# Patient Record
Sex: Female | Born: 1990
Health system: Southern US, Community
[De-identification: ages and names within clinical notes are randomized; demographics above are authoritative.]

## PROBLEM LIST (undated history)

## (undated) DIAGNOSIS — E079 Disorder of thyroid, unspecified: Secondary | ICD-10-CM

## (undated) DIAGNOSIS — E039 Hypothyroidism, unspecified: Secondary | ICD-10-CM

## (undated) HISTORY — DX: Disorder of thyroid, unspecified: E07.9

## (undated) HISTORY — DX: Hypothyroidism, unspecified: E03.9

## (undated) HISTORY — PX: TONSILLECTOMY AND ADENOIDECTOMY: SHX28

## (undated) HISTORY — PX: TONSILLECTOMY: SUR1361

---

## 2014-08-05 ENCOUNTER — Emergency Department: Payer: Self-pay | Admitting: Emergency Medicine

## 2014-08-05 LAB — URINALYSIS, COMPLETE
BACTERIA: NONE SEEN
Bilirubin,UR: NEGATIVE
Blood: NEGATIVE
GLUCOSE, UR: NEGATIVE mg/dL (ref 0–75)
KETONE: NEGATIVE
Leukocyte Esterase: NEGATIVE
Nitrite: NEGATIVE
PH: 6 (ref 4.5–8.0)
Protein: NEGATIVE
RBC,UR: <1 /HPF (ref 0–5)
Specific Gravity: 1.009 (ref 1.003–1.030)
Squamous Epithelial: <1
WBC UR: <1 /HPF (ref 0–5)

## 2014-08-05 LAB — COMPREHENSIVE METABOLIC PANEL
Albumin: 3.7 g/dL (ref 3.4–5.0)
Alkaline Phosphatase: 71 U/L
Anion Gap: 10 (ref 7–16)
BUN: 11 mg/dL (ref 7–18)
Bilirubin,Total: 0.3 mg/dL (ref 0.2–1.0)
CO2: 27 mmol/L (ref 21–32)
Calcium, Total: 9 mg/dL (ref 8.5–10.1)
Chloride: 104 mmol/L (ref 98–107)
Creatinine: 0.81 mg/dL (ref 0.60–1.30)
EGFR (Non-African Amer.): >60
Glucose: 79 mg/dL (ref 65–99)
Osmolality: 280 (ref 275–301)
Potassium: 4 mmol/L (ref 3.5–5.1)
SGOT(AST): 26 U/L (ref 15–37)
SGPT (ALT): 20 U/L
Sodium: 141 mmol/L (ref 136–145)
TOTAL PROTEIN: 7.6 g/dL (ref 6.4–8.2)

## 2014-08-05 LAB — CBC WITH DIFFERENTIAL/PLATELET
Basophil #: 0.1 10*3/uL (ref 0.0–0.1)
Basophil %: 0.5 %
Eosinophil #: 1 10*3/uL — ABNORMAL HIGH (ref 0.0–0.7)
Eosinophil %: 9.2 %
HCT: 42.3 % (ref 35.0–47.0)
HGB: 14.1 g/dL (ref 12.0–16.0)
Lymphocyte #: 2.7 10*3/uL (ref 1.0–3.6)
Lymphocyte %: 26.1 %
MCH: 30.9 pg (ref 26.0–34.0)
MCHC: 33.3 g/dL (ref 32.0–36.0)
MCV: 93 fL (ref 80–100)
Monocyte #: 1.1 x10 3/mm — ABNORMAL HIGH (ref 0.2–0.9)
Monocyte %: 10.3 %
Neutrophil #: 5.6 10*3/uL (ref 1.4–6.5)
Neutrophil %: 53.9 %
Platelet: 196 10*3/uL (ref 150–440)
RBC: 4.56 10*6/uL (ref 3.80–5.20)
RDW: 12.9 % (ref 11.5–14.5)
WBC: 10.4 10*3/uL (ref 3.6–11.0)

## 2014-08-05 LAB — LIPASE, BLOOD: Lipase: 184 U/L (ref 73–393)

## 2015-10-21 DIAGNOSIS — E038 Other specified hypothyroidism: Secondary | ICD-10-CM | POA: Insufficient documentation

## 2015-10-21 DIAGNOSIS — E063 Autoimmune thyroiditis: Secondary | ICD-10-CM

## 2016-08-13 DIAGNOSIS — N946 Dysmenorrhea, unspecified: Secondary | ICD-10-CM | POA: Insufficient documentation

## 2016-08-13 DIAGNOSIS — E039 Hypothyroidism, unspecified: Secondary | ICD-10-CM | POA: Insufficient documentation

## 2016-12-31 DIAGNOSIS — E063 Autoimmune thyroiditis: Secondary | ICD-10-CM | POA: Insufficient documentation

## 2017-12-31 ENCOUNTER — Ambulatory Visit (INDEPENDENT_AMBULATORY_CARE_PROVIDER_SITE_OTHER): Payer: BLUE CROSS/BLUE SHIELD | Admitting: Certified Nurse Midwife

## 2017-12-31 ENCOUNTER — Encounter: Payer: Self-pay | Admitting: Certified Nurse Midwife

## 2017-12-31 VITALS — BP 105/68 | HR 76 | Ht 66.0 in | Wt 207.2 lb

## 2017-12-31 DIAGNOSIS — Z0000 Encounter for general adult medical examination without abnormal findings: Secondary | ICD-10-CM | POA: Diagnosis not present

## 2017-12-31 NOTE — Progress Notes (Signed)
GYNECOLOGY ANNUAL PREVENTATIVE CARE ENCOUNTER NOTE  Subjective:   Julie Rowe is a 27 y.o. No obstetric history on file. female here for a routine annual gynecologic exam.  Current complaints: none. She states that she will be trying to get pregnant this year.    Denies abnormal vaginal bleeding, discharge, pelvic pain, problems with intercourse or other gynecologic concerns.    Gynecologic History Patient's last menstrual period was 12/09/2017 (exact date). Contraception: OCP (estrogen/progesterone) Last Pap: 2017. Results were: normal per pt Last mammogram: n/a.   Obstetric History OB History  Gravida Para Term Preterm AB Living  0 0 0 0 0 0  SAB TAB Ectopic Multiple Live Births  0 0 0 0 0        Past Medical History:  Diagnosis Date  . Hypothyroid   . Thyroid disease     Past Surgical History:  Procedure Laterality Date  . TONSILLECTOMY AND ADENOIDECTOMY      Current Outpatient Medications on File Prior to Visit  Medication Sig Dispense Refill  . SRONYX 0.1-20 MG-MCG tablet   6   No current facility-administered medications on file prior to visit.     No Known Allergies  Social History   Socioeconomic History  . Marital status: Unknown    Spouse name: Not on file  . Number of children: Not on file  . Years of education: Not on file  . Highest education level: Not on file  Social Needs  . Financial resource strain: Not on file  . Food insecurity - worry: Not on file  . Food insecurity - inability: Not on file  . Transportation needs - medical: Not on file  . Transportation needs - non-medical: Not on file  Occupational History  . Not on file  Tobacco Use  . Smoking status: Never Smoker  . Smokeless tobacco: Never Used  Substance and Sexual Activity  . Alcohol use: No    Frequency: Never  . Drug use: No  . Sexual activity: Yes    Birth control/protection: Pill  Other Topics Concern  . Not on file  Social History Narrative  . Not on file     Family History  Problem Relation Age of Onset  . Diabetes Mother   . Diabetes Brother   . Diabetes Maternal Grandmother     The following portions of the patient's history were reviewed and updated as appropriate: allergies, current medications, past family history, past medical history, past social history, past surgical history and problem list.  Review of Systems Pertinent items noted in HPI and remainder of comprehensive ROS otherwise negative.   Objective:  BP 105/68   Pulse 76   Ht 5\' 6"  (1.676 m)   Wt 207 lb 3 oz (94 kg)   LMP 12/09/2017 (Exact Date)   BMI 33.44 kg/m  CONSTITUTIONAL: Well-developed, well-nourished, obese female in no acute distress.  HENT:  Normocephalic, atraumatic, External right and left ear normal. Oropharynx is clear and moist EYES: Conjunctivae and EOM are normal. Pupils are equal, round, and reactive to light. No scleral icterus.  NECK: Normal range of motion, supple, no masses.  Normal thyroid.  SKIN: Skin is warm and dry. No rash noted. Not diaphoretic. No erythema. No pallor. NEUROLOGIC: Alert and oriented to person, place, and time. Normal reflexes, muscle tone coordination. No cranial nerve deficit noted. PSYCHIATRIC: Normal mood and affect. Normal behavior. Normal judgment and thought content. CARDIOVASCULAR: Normal heart rate noted, regular rhythm RESPIRATORY: Clear to auscultation bilaterally. Effort and breath sounds  normal, no problems with respiration noted. BREASTS: Symmetric in size. No masses, skin changes, nipple drainage, or lymphadenopathy. ABDOMEN: Soft, normal bowel sounds, no distention noted.  No tenderness, rebound or guarding.  PELVIC: Normal appearing external genitalia; normal appearing vaginal mucosa and cervix.  No abnormal discharge noted.  Pap smear obtained. ectropion present, contract bleeding present. Normal uterine size, no other palpable masses, no uterine or adnexal tenderness. MUSCULOSKELETAL: Normal range of  motion. No tenderness.  No cyanosis, clubbing, or edema.  2+ distal pulses.   Assessment and Plan:  Well women exam. Labs; Pap & lipid profile.  Will follow up results of pap smear and manage accordingly. Has changed provider and will make appointment with primary care to follow her hypothyroidism. Preconception counseling. Encouraged weight loss. Sample of PNV given. Mammogram not indicated.  Routine preventative health maintenance measures emphasized. Please refer to After Visit Summary for other counseling recommendations.    Doreene Burke, CNM

## 2017-12-31 NOTE — Progress Notes (Signed)
New pt is here for an annual exam. LPS 11/17 WNL. No history of abnormals.

## 2017-12-31 NOTE — Patient Instructions (Signed)
Preventive Care 18-39 Years, Female Preventive care refers to lifestyle choices and visits with your health care provider that can promote health and wellness. What does preventive care include?  A yearly physical exam. This is also called an annual well check.  Dental exams once or twice a year.  Routine eye exams. Ask your health care provider how often you should have your eyes checked.  Personal lifestyle choices, including: ? Daily care of your teeth and gums. ? Regular physical activity. ? Eating a healthy diet. ? Avoiding tobacco and drug use. ? Limiting alcohol use. ? Practicing safe sex. ? Taking vitamin and mineral supplements as recommended by your health care provider. What happens during an annual well check? The services and screenings done by your health care provider during your annual well check will depend on your age, overall health, lifestyle risk factors, and family history of disease. Counseling Your health care provider may ask you questions about your:  Alcohol use.  Tobacco use.  Drug use.  Emotional well-being.  Home and relationship well-being.  Sexual activity.  Eating habits.  Work and work Statistician.  Method of birth control.  Menstrual cycle.  Pregnancy history.  Screening You may have the following tests or measurements:  Height, weight, and BMI.  Diabetes screening. This is done by checking your blood sugar (glucose) after you have not eaten for a while (fasting).  Blood pressure.  Lipid and cholesterol levels. These may be checked every 5 years starting at age 66.  Skin check.  Hepatitis C blood test.  Hepatitis B blood test.  Sexually transmitted disease (STD) testing.  BRCA-related cancer screening. This may be done if you have a family history of breast, ovarian, tubal, or peritoneal cancers.  Pelvic exam and Pap test. This may be done every 3 years starting at age 40. Starting at age 59, this may be done every 5  years if you have a Pap test in combination with an HPV test.  Discuss your test results, treatment options, and if necessary, the need for more tests with your health care provider. Vaccines Your health care provider may recommend certain vaccines, such as:  Influenza vaccine. This is recommended every year.  Tetanus, diphtheria, and acellular pertussis (Tdap, Td) vaccine. You may need a Td booster every 10 years.  Varicella vaccine. You may need this if you have not been vaccinated.  HPV vaccine. If you are 69 or younger, you may need three doses over 6 months.  Measles, mumps, and rubella (MMR) vaccine. You may need at least one dose of MMR. You may also need a second dose.  Pneumococcal 13-valent conjugate (PCV13) vaccine. You may need this if you have certain conditions and were not previously vaccinated.  Pneumococcal polysaccharide (PPSV23) vaccine. You may need one or two doses if you smoke cigarettes or if you have certain conditions.  Meningococcal vaccine. One dose is recommended if you are age 27-21 years and a first-year college student living in a residence hall, or if you have one of several medical conditions. You may also need additional booster doses.  Hepatitis A vaccine. You may need this if you have certain conditions or if you travel or work in places where you may be exposed to hepatitis A.  Hepatitis B vaccine. You may need this if you have certain conditions or if you travel or work in places where you may be exposed to hepatitis B.  Haemophilus influenzae type b (Hib) vaccine. You may need this if  you have certain risk factors.  Talk to your health care provider about which screenings and vaccines you need and how often you need them. This information is not intended to replace advice given to you by your health care provider. Make sure you discuss any questions you have with your health care provider. Document Released: 01/05/2002 Document Revised: 07/29/2016  Document Reviewed: 09/10/2015 Elsevier Interactive Patient Education  [employer].

## 2018-01-01 LAB — LIPID PANEL
CHOL/HDL RATIO: 4.7 ratio — AB (ref 0.0–4.4)
Cholesterol, Total: 179 mg/dL (ref 100–199)
HDL: 38 mg/dL — AB (ref >39)
LDL Calculated: 117 mg/dL — ABNORMAL HIGH (ref 0–99)
Triglycerides: 122 mg/dL (ref 0–149)
VLDL Cholesterol Cal: 24 mg/dL (ref 5–40)

## 2018-01-03 ENCOUNTER — Encounter: Payer: Self-pay | Admitting: Certified Nurse Midwife

## 2018-01-03 LAB — PAP IG W/ RFLX HPV ASCU: PAP SMEAR COMMENT: 0

## 2018-01-10 ENCOUNTER — Other Ambulatory Visit: Payer: Self-pay | Admitting: Certified Nurse Midwife

## 2018-01-10 DIAGNOSIS — E039 Hypothyroidism, unspecified: Secondary | ICD-10-CM

## 2018-01-19 ENCOUNTER — Telehealth: Payer: Self-pay | Admitting: Certified Nurse Midwife

## 2018-01-19 NOTE — Telephone Encounter (Signed)
The patient called and stated that she has yet to receive a call for the referral that was sent in, so the patient called the office and found out that the referral has to be faxed to the office, there are not able to see the electronic referral that was entered. The patient also has a fax #: [phone]. No other information was disclosed. Please advise.

## 2018-01-19 NOTE — Telephone Encounter (Signed)
I sent the referral to [facility]. The number she gave is to [facility]. If she would prefer them I will have to print out all her records and fax there. Thanks

## 2018-01-20 ENCOUNTER — Telehealth: Payer: Self-pay

## 2018-01-20 NOTE — Telephone Encounter (Signed)
Pt  called back and apologized for the confusion but [facility] was fine with her- it is in network. Advised she will be hearing soon about appointment.

## 2018-03-20 NOTE — Progress Notes (Signed)
Patient ID: Julie Rowe, female   DOB: 1991/11/06, 27 y.o.   MRN: [id number]           Referring PCP: Doreene Burke  Reason for Appointment:  Hypothyroidism, new visit     History of Present Illness:   Hypothyroidism was first diagnosed in 2011  At the time of diagnosis patient was having symptoms of  fatigue, cold sensitivity but no significant weight gain or hair loss.  She was not told to have a goiter initially but ultrasound did show mild thyroid enlargement in 2014 She does not know what her labs showed initially what she was started on low-dose thyroxine supplement         With starting thyroid supplementation the patient's symptoms improved As of 2016 she was still taking the equivalent of 50 mcg levothyroxine 6 days a week with a normal TSH of 1.6 No further records are available In 10/2016 her dose was apparently increased up to 88 mcg of levothyroxine but not clear if she felt any better with this change  She continues to have normal energy level and no cold intolerance Has concerns about difficulty losing weight  She had been followed by her PCP until about summer 2018 and has not had any further follow-up She takes her levothyroxine at bedtime, usually 3 to 4 hours after dinner without any food or other intake  She thinks her last thyroid levels were checked in June 2018 and were reportedly normal         Patient's weight history is as follows:  Wt Readings from Last 3 Encounters:  03/21/18 203 lb (92.1 kg)  12/31/17 207 lb 3 oz (94 kg)    Thyroid function results have been as follows:  No results found for: TSH, FREET4, T3FREE   Past Medical History:  Diagnosis Date  . Hypothyroid   . Thyroid disease     Past Surgical History:  Procedure Laterality Date  . TONSILLECTOMY AND ADENOIDECTOMY      Family History  Problem Relation Age of Onset  . Diabetes Mother   . Diabetes Brother   . Diabetes Maternal Grandmother     Social History:  reports  that she has never smoked. She has never used smokeless tobacco. She reports that she does not drink alcohol or use drugs.  Allergies: No Known Allergies  Allergies as of 03/21/2018   No Known Allergies     Medication List        Accurate as of 03/21/18  4:17 PM. Always use your most recent med list.          SYNTHROID 88 MCG tablet Generic drug:  levothyroxine Take 1 tablet by mouth daily.          Review of Systems  Constitutional: Negative for weight gain and reduced appetite.  HENT: Negative for hoarseness and trouble swallowing.   Respiratory: Negative for shortness of breath.   Cardiovascular: Negative for leg swelling.  Gastrointestinal: Negative for constipation.  Endocrine: Positive for menstrual changes.       Recently has gone off her birth control pill and has not had regular periods since then  Musculoskeletal: Negative for joint pain.  Neurological: Negative for weakness.  Psychiatric/Behavioral: Negative for insomnia.                Examination:    BP 120/80 (BP Location: Left Arm, Patient Position: Sitting, Cuff Size: Normal)   Pulse 74   Ht  (1.676 m)   Wt 203  lb (92.1 kg)   LMP 02/07/2018   SpO2 98%   BMI 32.77 kg/m   GENERAL:  Average build.  Mild generalized obesity present  No pallor, cervical lymphadenopathy or edema.  Skin:  no rash or pigmentation.  EYES:  No prominence of the eyes or swelling of the eyelids  ENT: Oral mucosa and tongue normal.  THYROID:  Not palpable.  HEART:  Normal  S1 and S2; no murmur or click.  CHEST:    Lungs: Vescicular breath sounds heard equally.  No crepitations/ wheeze.  ABDOMEN:  No distention.  Liver and spleen not palpable.  No other mass or tenderness.  NEUROLOGICAL: Reflexes are bilaterally normal to slightly brisk at biceps.  Ankle reflexes normal  JOINTS:  Normal.   Assessment:  HYPOTHYROIDISM, autoimmune and diagnosed about 8 years ago She is objectively doing well without  recurrence of her original symptoms of fatigue or cold intolerance Although she is concerned about her weight it appears that her weight is not increasing this year  She is taking her levothyroxine consistently but is taking it at bedtime rather than before breakfast although on empty stomach  Patient has a strong family history of hypothyroidism She did not have any understanding of the etiology of autoimmune thyroid disease and discussed this today Also discussed that she may need a potentially higher dose of levothyroxine in the future  PLAN:   Check thyroid levels and decide on dosage adjustment If she is having any fluctuation in her thyroid levels may need to have her try taking her levothyroxine 15 to 30 minutes before breakfast instead of bedtime  She does desire to conceive and have a pregnancy in the near future and explained to her that during the pregnancy she may need increased doses of levothyroxine and will need to be monitored every 2 months  Follow-up in 6 months if her thyroid levels are normal   Reather Littler 03/21/2018, 4:17 PM   Consultation note copy sent to the PCP  Note: This office note was prepared with Dragon voice recognition system technology. Any transcriptional errors that result from this process are unintentional.

## 2018-03-21 ENCOUNTER — Encounter: Payer: Self-pay | Admitting: Endocrinology

## 2018-03-21 ENCOUNTER — Ambulatory Visit (INDEPENDENT_AMBULATORY_CARE_PROVIDER_SITE_OTHER): Payer: BLUE CROSS/BLUE SHIELD | Admitting: Endocrinology

## 2018-03-21 VITALS — BP 120/80 | HR 74 | Ht 66.0 in | Wt 203.0 lb

## 2018-03-21 DIAGNOSIS — E063 Autoimmune thyroiditis: Secondary | ICD-10-CM

## 2018-03-22 ENCOUNTER — Encounter: Payer: Self-pay | Admitting: Endocrinology

## 2018-03-22 LAB — TSH: TSH: 2.25 u[IU]/mL (ref 0.35–4.50)

## 2018-03-22 LAB — T4, FREE: FREE T4: 0.82 ng/dL (ref 0.60–1.60)

## 2018-03-23 ENCOUNTER — Other Ambulatory Visit: Payer: Self-pay

## 2018-03-23 MED ORDER — LEVOTHYROXINE SODIUM 88 MCG PO TABS: 88.0000 ug | ORAL_TABLET | Freq: Every day | ORAL | 0 refills | Status: DC

## 2018-03-23 MED ORDER — SYNTHROID 88 MCG PO TABS: 88.0000 ug | ORAL_TABLET | Freq: Every day | ORAL | 0 refills | Status: DC

## 2018-08-08 ENCOUNTER — Other Ambulatory Visit: Payer: Self-pay | Admitting: Endocrinology

## 2018-08-24 ENCOUNTER — Ambulatory Visit: Payer: BLUE CROSS/BLUE SHIELD | Admitting: Certified Nurse Midwife

## 2018-08-24 ENCOUNTER — Encounter: Payer: Self-pay | Admitting: Certified Nurse Midwife

## 2018-08-24 VITALS — BP 112/83 | HR 77 | Ht 66.0 in | Wt 206.1 lb

## 2018-08-24 DIAGNOSIS — Z319 Encounter for procreative management, unspecified: Secondary | ICD-10-CM | POA: Diagnosis not present

## 2018-08-24 NOTE — Progress Notes (Signed)
GYN ENCOUNTER NOTE  Subjective:       Julie Rowe is a 27 y.o. G0P0000 female is here for gynecologic evaluation of the following issues:  1 Desires pregnancy. Pt has been off of birth control since 11/2017. She has irregular cycles every 25-50 days that last for 5 days.    Gynecologic History Patient's last menstrual period was 08/23/2018. Contraception: none Last Pap: 12/31/17. Results were: normal Last mammogram: n/a.  Obstetric History OB History  Gravida Para Term Preterm AB Living  0 0 0 0 0 0  SAB TAB Ectopic Multiple Live Births  0 0 0 0 0    Past Medical History:  Diagnosis Date  . Hypothyroid   . Thyroid disease     Past Surgical History:  Procedure Laterality Date  . TONSILLECTOMY AND ADENOIDECTOMY      Current Outpatient Medications on File Prior to Visit  Medication Sig Dispense Refill  . Prenatal Vit-Fe Fumarate-FA (PRENATAL MULTIVITAMIN) TABS tablet Take 1 tablet by mouth daily at 12 noon.    Marland Kitchen SYNTHROID 88 MCG tablet TAKE 1 TABLET BY MOUTH EVERY DAY 90 tablet 0   No current facility-administered medications on file prior to visit.     No Known Allergies  Social History   Socioeconomic History  . Marital status: Married    Spouse name: Not on file  . Number of children: Not on file  . Years of education: Not on file  . Highest education level: Not on file  Occupational History  . Not on file  Social Needs  . Financial resource strain: Not on file  . Food insecurity:    Worry: Not on file    Inability: Not on file  . Transportation needs:    Medical: Not on file    Non-medical: Not on file  Tobacco Use  . Smoking status: Never Smoker  . Smokeless tobacco: Never Used  Substance and Sexual Activity  . Alcohol use: No    Frequency: Never  . Drug use: No  . Sexual activity: Yes    Birth control/protection: None  Lifestyle  . Physical activity:    Days per week: Not on file    Minutes per session: Not on file  . Stress: Not on file   Relationships  . Social connections:    Talks on phone: Not on file    Gets together: Not on file    Attends religious service: Not on file    Active member of club or organization: Not on file    Attends meetings of clubs or organizations: Not on file    Relationship status: Not on file  . Intimate partner violence:    Fear of current or ex partner: Not on file    Emotionally abused: Not on file    Physically abused: Not on file    Forced sexual activity: Not on file  Other Topics Concern  . Not on file  Social History Narrative  . Not on file    Family History  Problem Relation Age of Onset  . Diabetes Mother   . Thyroid disease Mother   . Diabetes Brother   . Thyroid disease Brother   . Diabetes Maternal Grandmother   . Thyroid disease Paternal Grandmother     The following portions of the patient's history were reviewed and updated as appropriate: allergies, current medications, past family history, past medical history, past social history, past surgical history and problem list.  Review of Systems Review of Systems -  Negative except except as mentioned in HPI Review of Systems - General ROS: negative for - chills, fatigue, fever, hot flashes, malaise or night sweats Hematological and Lymphatic ROS: negative for - bleeding problems or swollen lymph nodes Gastrointestinal ROS: negative for - abdominal pain, blood in stools, change in bowel habits and nausea/vomiting Musculoskeletal ROS: negative for - joint pain, muscle pain or muscular weakness Genito-Urinary ROS: negative for - change in menstrual cycle, dysmenorrhea, dyspareunia, dysuria, genital discharge, genital ulcers, hematuria, incontinence, irregular/heavy menses, nocturia or pelvic pain. Positive for irregular cycles  Objective:   BP 112/83   Pulse 77   Ht 5\' 6"  (1.676 m)   Wt 206 lb 1 oz (93.5 kg)   LMP 08/23/2018   BMI 33.26 kg/m  CONSTITUTIONAL: Well-developed, well-nourished female in no acute  distress.  HENT:  Normocephalic, atraumatic.  NECK: Normal range of motion, supple, no masses.  Normal thyroid.  SKIN: Skin is warm and dry. No rash noted. Not diaphoretic. No erythema. No pallor. NEUROLGIC: Alert and oriented to person, place, and time.  PSYCHIATRIC: Normal mood and affect. Normal behavior. Normal judgment and thought content. CARDIOVASCULAR:Not Examined RESPIRATORY: Not Examined BREASTS: Not Examined ABDOMEN: Soft, non distended; Non tender.  No Organomegaly. PELVIC:Not indicated done at annual 12/31/17 MUSCULOSKELETAL: Normal range of motion. No tenderness.  No cyanosis, clubbing, or edema.   Assessment:   1. Patient desires pregnancy - Estradiol - FSH/LH - Prolactin - US PELVIS (TRANSABDOMINAL ONLY); Future     Plan:   Pt encouraged to loose weight, labs today for abnormal uterine bleeding and u/s to r/o PCOS.Encouraged semen analysis for her partner. Information given.  Discussed induction of ovulation with clomid. She verbalizes and agrees to plan. Will follow up results.   I attest more than 50% of visit spent reviewing pt history, discussing abnormal uterine bleeding, use of clomid for ovulation induction. Face to face time 15 min.   Doreene Burke, CNM

## 2018-08-24 NOTE — Patient Instructions (Signed)
Abnormal Uterine Bleeding  Abnormal uterine bleeding means bleeding more than usual from your uterus. It can include:   Bleeding between periods.   Bleeding after sex.   Bleeding that is heavier than normal.   Periods that last longer than usual.   Bleeding after you have stopped having your period (menopause).    There are many problems that may cause this. You should see a doctor for any kind of bleeding that is not normal. Treatment depends on the cause of the bleeding.  Follow these instructions at home:   Watch your condition for any changes.   Do not use tampons, douche, or have sex, if your doctor tells you not to.   Change your pads often.   Get regular well-woman exams. Make sure they include a pelvic exam and cervical cancer screening.   Keep all follow-up visits as told by your doctor. This is important.  Contact a doctor if:   The bleeding lasts more than one week.   You feel dizzy at times.   You feel like you are going to throw up (nauseous).   You throw up.  Get help right away if:   You pass out.   You have to change pads every hour.   You have belly (abdominal) pain.   You have a fever.   You get sweaty.   You get weak.   You passing large blood clots from your vagina.  Summary   Abnormal uterine bleeding means bleeding more than usual from your uterus.   There are many problems that may cause this. You should see a doctor for any kind of bleeding that is not normal.   Treatment depends on the cause of the bleeding.  This information is not intended to replace advice given to you by your health care provider. Make sure you discuss any questions you have with your health care provider.  Document Released: 09/06/2009 Document Revised: 11/03/2016 Document Reviewed: 11/03/2016  Elsevier Interactive Patient Education  2017 [employer]  Infertility  Infertility is when you are unable to get pregnant (conceive) after a year of having sex regularly without using birth control.  Infertility can also mean that a woman is not able to carry a pregnancy to full term.  Both women and men can have fertility problems.  What causes infertility?  What Causes Infertility in Women?  There are many possible causes of infertility in women. For some women, the cause of infertility is not known (unexplained infertility). Infertility can also be linked to more than one cause. Infertility problems in women can be caused by problems with the menstrual cycle or reproductive organs, certain medical conditions, and factors related to lifestyle and age.   Problems with your menstrual cycle can interfere with your ovaries producing eggs (ovulation). This can make it difficult to get pregnant. This includes having a menstrual cycle that is very long, very short, or irregular.   Problems with reproductive organs can include:  ? An abnormally narrow cervix or a cervix that does not remain closed during a pregnancy.  ? A blockage in your fallopian tubes.  ? An abnormally shaped uterus.  ? Uterine fibroids. This is a tissue mass (tumor) that can develop on your uterus.   Medical conditions that can affect a woman's fertility include:  ? Polycystic ovarian syndrome (PCOS). This is a hormonal disorder that can cause small cysts to grow on your ovaries. This is the most common cause of infertility in women.  ?   Endometriosis. This is a condition in which the tissue that lines your uterus (endometrium) grows outside of its normal location.  ? Primary ovary insufficiency. This is when your ovaries stop producing eggs and hormones before the age of 40.  ? Sexually transmitted diseases, such as chlamydia or gonorrhea. These infections can cause scarring in your fallopian tubes. This makes it difficult for eggs to reach your uterus.  ? Autoimmune disorders. These are disorders in which your immune system attacks normal, healthy cells.  ? Hormone imbalances.   Other factors include:  ? Age. A woman's fertility declines with  age, especially after her mid-30s.  ? Being under- or overweight.  ? Drinking too much alcohol.  ? Using drugs.  ? Exercising excessively.  ? Being exposed to environmental toxins, such as radiation, pesticides, and certain chemicals.    What Causes Infertility in Men?  There are many causes of infertility in men. Infertility can be linked to more than one cause. Infertility problems in men can be caused by problems with sperm or the reproductive organs, certain medical conditions, and factors related to lifestyle and age. Some men have unexplained infertility.   Problems with sperm. Infertility can result if there is a problem producing:  ? Enough sperm (low sperm count).  ? Enough normally-shaped sperm (sperm morphology).  ? Sperm that are able to reach the egg (poor motility).   Infertility can also be caused by:  ? A problem with hormones.  ? Enlarged veins (varicoceles), cysts (spermatoceles), or tumors of the testicles.  ? Sexual dysfunction.  ? Injury to the testicles.  ? A birth defect, such as not having the tubes that carry sperm (vas deferens).   Medical conditions that can affect a man's fertility include:  ? Diabetes.  ? Cancer treatments, such as chemotherapy or radiation.  ? Klinefelter syndrome. This is an inherited genetic disorder.  ? Thyroid problems, such as an under- or overactive thyroid.  ? Cystic fibrosis.  ? Sexually transmitted diseases.   Other factors include:  ? Age. A man's fertility declines with age.  ? Drinking too much alcohol.  ? Using drugs.  ? Being exposed to environmental toxins, such as pesticides and lead.    What are the symptoms of infertility?  Being unable to get pregnant after one year of having regular sex without using birth control is the only sign of infertility.  How is infertility diagnosed?  In order to be diagnosed with infertility, both partners will have a physical exam. Both partners will also have an extensive medical and sexual history taken. If there is  no obvious reason for infertility, additional tests may be done.  What Tests Will Women Have?  Women may first have tests to check whether they are ovulating each month. The tests may include:   Blood tests to check hormone levels.   An ultrasound of the ovaries. This looks for possible problems on or in the ovaries.   Taking a small sample of the tissue that lines the uterus for examination under a microscope (endometrial biopsy).    Women who are ovulating may have additional tests. These may include:   Hysterosalpingography.  ? This is an X-ray of the fallopian tubes and uterus taken after a specific type of dye is injected.  ? This test can show the shape of the uterus and whether the fallopian tubes are open.   Laparoscopy.  ? In this test, a lighted tube (laparoscope) is used to look for problems   in the fallopian tubes and other female organs.   Transvaginal ultrasound.  ? This is an imaging test to check for abnormalities of the uterus and ovaries.  ? A health care provider can use this test to count the number of follicles on the ovaries.   Hysteroscopy.  ? This test involves using a lighted tube to examine the cervix and inside the uterus.  ? It is done to find any abnormalities inside the uterus.    What Tests Will Men Have?  Tests for men's infertility includes:   Semen tests to check sperm count, morphology, and motility.   Blood tests to check for hormone levels.   Taking a small sample of tissue from inside a testicle (biopsy). This is examined under a microscope.   Blood tests to check for genetic abnormalities (genetic testing).    How are women treated for infertility?  Treatment depends on the cause of infertility. Most cases of infertility in women are treated with medicine or surgery.   Women may take medicine to:  ? Correct ovulation problems.  ? Treat other health conditions, such as PCOS.   Surgery may be done to:  ? Repair damage to the ovaries, fallopian tubes, cervix, or  uterus.  ? Remove growths from the uterus.  ? Remove scar tissue from the uterus, pelvis, or other female organs.    How are men treated for infertility?  Treatment depends on the cause of infertility. Most cases of infertility in men are treated with medicine or surgery.   Men may take medicine to:  ? Correct hormone problems.  ? Treat other health conditions.  ? Treat sexual dysfunction.   Surgery may be done to:  ? Remove blockages in the reproductive tract.  ? Correct other structural problems of the reproductive tract.    What is assisted reproductive technology?  Assisted reproductive technology (ART) refers to all treatments and procedures that combine eggs and sperm outside the body to try to help a couple conceive. ART is often combined with fertility drugs to stimulate ovulation. Sometimes ART is done using eggs retrieved from another woman's body (donor eggs) or from previously frozen fertilized eggs (embryos).  There are different types of ART. These include:   Intrauterine insemination (IUI).  ? In this procedure, sperm is placed directly into a woman's uterus with a long, thin tube.  ? This may be most effective for infertility caused by sperm problems, including low sperm count and low motility.  ? Can be used in combination with fertility drugs.   In vitro fertilization (IVF).  ? This is often done when a woman's fallopian tubes are blocked or when a man has low sperm counts.  ? Fertility drugs stimulate the ovaries to produce multiple eggs. Once mature, these eggs are removed from the body and combined with the sperm to be fertilized.  ? These fertilized eggs are then placed in the woman's uterus.    This information is not intended to replace advice given to you by your health care provider. Make sure you discuss any questions you have with your health care provider.  Document Released: 11/12/2003 Document Revised: 04/10/2016 Document Reviewed: 07/25/2014  Elsevier Interactive Patient Education   2018 [employer]

## 2018-08-25 LAB — PROLACTIN: Prolactin: 15.3 ng/mL (ref 4.8–23.3)

## 2018-08-25 LAB — FSH/LH
FSH: 3.9 m[IU]/mL
LH: 3.9 m[IU]/mL

## 2018-08-25 LAB — ESTRADIOL: Estradiol: 39.3 pg/mL

## 2018-08-31 ENCOUNTER — Ambulatory Visit (INDEPENDENT_AMBULATORY_CARE_PROVIDER_SITE_OTHER): Payer: BLUE CROSS/BLUE SHIELD

## 2018-08-31 DIAGNOSIS — Z319 Encounter for procreative management, unspecified: Secondary | ICD-10-CM

## 2018-09-20 ENCOUNTER — Ambulatory Visit: Payer: BLUE CROSS/BLUE SHIELD | Admitting: Endocrinology

## 2018-09-27 ENCOUNTER — Ambulatory Visit (INDEPENDENT_AMBULATORY_CARE_PROVIDER_SITE_OTHER): Payer: BLUE CROSS/BLUE SHIELD | Admitting: Podiatry

## 2018-09-27 VITALS — BP 131/78 | HR 67

## 2018-09-27 DIAGNOSIS — L600 Ingrowing nail: Secondary | ICD-10-CM

## 2018-09-27 MED ORDER — CEPHALEXIN 500 MG PO CAPS: 500.0000 mg | ORAL_CAPSULE | Freq: Three times a day (TID) | ORAL | 0 refills | Status: DC

## 2018-09-27 NOTE — Progress Notes (Signed)
Subjective:   Patient ID: Julie Rowe, female   DOB: 27 y.o.   MRN: [id number]   HPI  Julie Rowe presents Today for concerns of ingrown transudate big toe, lateral aspect which is been ongoing about 1 week.  She did try go to urgent care to have the area dressed but they do not want to do anything and she was referred to [place].  She states that the area is tender at times but denies any drainage or pus is been red and swollen on the corner.  No red streaks.  No other concerns.  No other treatment.  Review of Systems  All other systems reviewed and are negative.  Past Medical History:  Diagnosis Date  . Hypothyroid   . Thyroid disease     Past Surgical History:  Procedure Laterality Date  . TONSILLECTOMY AND ADENOIDECTOMY       Current Outpatient Medications:  .  Prenatal Vit-Fe Fumarate-FA (PRENATAL MULTIVITAMIN) TABS tablet, Take 1 tablet by mouth daily at 12 noon., Disp: , Rfl:  .  SYNTHROID 88 MCG tablet, TAKE 1 TABLET BY MOUTH EVERY DAY, Disp: 90 tablet, Rfl: 0 .  cephALEXin (KEFLEX) 500 MG capsule, Take 1 capsule (500 mg total) by mouth 3 (three) times daily., Disp: 30 capsule, Rfl: 0  No Known Allergies       Objective:  Physical Exam  General: AAO x3, NAD  Dermatological: Incurvation present to the lateral aspect the right hallux toenail with tenderness palpation.  Mild localized edema and erythema to the nail corner but there is no ascending cellulitis.  There is no fluctuation or crepitation there is no drainage or pus.  No malodor.  No open lesions.  Vascular: Dorsalis Pedis artery and Posterior Tibial artery pedal pulses are 2/4 bilateral with immedate capillary fill time. Pedal hair growth present. No varicosities and no lower extremity edema present bilateral. There is no pain with calf compression, swelling, warmth, erythema.   Neruologic: Grossly intact via light touch bilateral.  Protective threshold with Semmes Wienstein monofilament intact to all pedal sites  bilateral.  Musculoskeletal: No gross boney pedal deformities bilateral. No pain, crepitus, or limitation noted with foot and ankle range of motion bilateral. Muscular strength 5/5 in all groups tested bilateral.  Gait: Unassisted, Nonantalgic.       Assessment:   Right lateral hallux ingrown toenail    Plan:  -Treatment options discussed including all alternatives, risks, and complications -Etiology of symptoms were discussed At this time, the patient is requesting partial nail removal with chemical matricectomy to the symptomatic portion of the nail. Risks and complications were discussed with the patient for which they understand and written consent was obtained. Under sterile conditions a total of 3 mL of a mixture of 2% lidocaine plain and 0.5% Marcaine plain was infiltrated in a hallux block fashion. Once anesthetized, the skin was prepped in sterile fashion. A tourniquet was then applied. Next the latereal aspect of hallux nail border was then sharply excised making sure to remove the entire offending nail border. Once the nails were ensured to be removed area was debrided and the underlying skin was intact. There is no purulence identified in the procedure. Next phenol was then applied under standard conditions and copiously irrigated. Silvadene was applied. A dry sterile dressing was applied. After application of the dressing the tourniquet was removed and there is found to be an immediate capillary refill time to the digit. The patient tolerated the procedure well any complications. Post procedure instructions  were discussed the patient for which he verbally understood. Follow-up in one week for nail check or sooner if any problems are to arise. Discussed signs/symptoms of infection and directed to call the office immediately should any occur or go directly to the emergency room. In the meantime, encouraged to call the office with any questions, concerns, changes symptoms. -Keflex  *she  would like to follow-up in [place] if able.   Vivi Barrack DPM

## 2018-09-27 NOTE — Patient Instructions (Signed)

## 2018-09-28 ENCOUNTER — Ambulatory Visit: Payer: BLUE CROSS/BLUE SHIELD | Admitting: Endocrinology

## 2018-10-04 ENCOUNTER — Ambulatory Visit (INDEPENDENT_AMBULATORY_CARE_PROVIDER_SITE_OTHER): Payer: BLUE CROSS/BLUE SHIELD | Admitting: Podiatry

## 2018-10-04 ENCOUNTER — Encounter: Payer: Self-pay | Admitting: Podiatry

## 2018-10-04 DIAGNOSIS — L600 Ingrowing nail: Secondary | ICD-10-CM

## 2018-10-04 NOTE — Progress Notes (Signed)
   Subjective: Patient presents today 2 weeks post ingrown nail permanent nail avulsion procedure of the lateral border of the right hallux. Patient states that the toe and nail fold is feeling much better. She denies any pain or drainage. Patient is here for further evaluation and treatment.   Past Medical History:  Diagnosis Date  . Hypothyroid   . Thyroid disease     Objective: Skin is warm, dry and supple. Nail and respective nail fold appears to be healing appropriately. Open wound to the associated nail fold with a granular wound base and moderate amount of fibrotic tissue. Minimal drainage noted. Mild erythema around the periungual region likely due to phenol chemical matricectomy.  Assessment: #1 postop permanent partial nail avulsion lateral border right hallux  #2 open wound periungual nail fold of respective digit.   Plan of care: #1 patient was evaluated  #2 debridement of open wound was performed to the periungual border of the respective toe using a currette. Antibiotic ointment and Band-Aid was applied. #3 patient is to return to clinic on a PRN basis.   Felecia Shelling, DPM [facility]  Dr. Felecia Shelling, DPM    [address]                                        [place], [place] [address]                Office [phone]  Fax [phone]

## 2018-10-14 ENCOUNTER — Ambulatory Visit: Payer: BLUE CROSS/BLUE SHIELD | Admitting: Certified Nurse Midwife

## 2018-10-14 ENCOUNTER — Encounter: Payer: Self-pay | Admitting: Certified Nurse Midwife

## 2018-10-14 VITALS — BP 114/78 | HR 78 | Ht 66.0 in | Wt 209.0 lb

## 2018-10-14 DIAGNOSIS — Z3201 Encounter for pregnancy test, result positive: Secondary | ICD-10-CM

## 2018-10-14 DIAGNOSIS — N926 Irregular menstruation, unspecified: Secondary | ICD-10-CM

## 2018-10-14 DIAGNOSIS — Z3490 Encounter for supervision of normal pregnancy, unspecified, unspecified trimester: Secondary | ICD-10-CM | POA: Insufficient documentation

## 2018-10-14 DIAGNOSIS — Z3A01 Less than 8 weeks gestation of pregnancy: Secondary | ICD-10-CM

## 2018-10-14 LAB — POCT URINE PREGNANCY: Preg Test, Ur: POSITIVE — AB

## 2018-10-14 NOTE — Patient Instructions (Addendum)
Prenatal Care WHAT IS PRENATAL CARE? Prenatal care is the process of caring for a pregnant woman before she gives birth. Prenatal care makes sure that she and her baby remain as healthy as possible throughout pregnancy. Prenatal care may be provided by a midwife, family practice health care provider, or a childbirth and pregnancy specialist (obstetrician). Prenatal care may include physical examinations, testing, treatments, and education on nutrition, lifestyle, and social support services. WHY IS PRENATAL CARE SO IMPORTANT? Early and consistent prenatal care increases the chance that you and your baby will remain healthy throughout your pregnancy. This type of care also decreases a baby's risk of being born too early (prematurely), or being born smaller than expected (small for gestational age). Any underlying medical conditions you may have that could pose a risk during your pregnancy are discussed during prenatal care visits. You will also be monitored regularly for any new conditions that may arise during your pregnancy so they can be treated quickly and effectively. WHAT HAPPENS DURING PRENATAL CARE VISITS? Prenatal care visits may include the following: Discussion Tell your health care provider about any new signs or symptoms you have experienced since your last visit. These might include:  Nausea or vomiting.  Increased or decreased level of energy.  Difficulty sleeping.  Back or leg pain.  Weight changes.  Frequent urination.  Shortness of breath with physical activity.  Changes in your skin, such as the development of a rash or itchiness.  Vaginal discharge or bleeding.  Feelings of excitement or nervousness.  Changes in your baby's movements.  You may want to write down any questions or topics you want to discuss with your health care provider and bring them with you to your appointment. Examination During your first prenatal care visit, you will likely have a complete  physical exam. Your health care provider will often examine your vagina, cervix, and the position of your uterus, as well as check your heart, lungs, and other body systems. As your pregnancy progresses, your health care provider will measure the size of your uterus and your baby's position inside your uterus. He or she may also examine you for early signs of labor. Your prenatal visits may also include checking your blood pressure and, after about 10-12 weeks of pregnancy, listening to your baby's heartbeat. Testing Regular testing often includes:  Urinalysis. This checks your urine for glucose, protein, or signs of infection.  Blood count. This checks the levels of white and red blood cells in your body.  Tests for sexually transmitted infections (STIs). Testing for STIs at the beginning of pregnancy is routinely done and is required in many states.  Antibody testing. You will be checked to see if you are immune to certain illnesses, such as rubella, that can affect a developing fetus.  Glucose screen. Around 24-28 weeks of pregnancy, your blood glucose level will be checked for signs of gestational diabetes. Follow-up tests may be recommended.  Group B strep. This is a bacteria that is commonly found inside a woman's vagina. This test will inform your health care provider if you need an antibiotic to reduce the amount of this bacteria in your body prior to labor and childbirth.  Ultrasound. Many pregnant women undergo an ultrasound screening around 18-20 weeks of pregnancy to evaluate the health of the fetus and check for any developmental abnormalities.  HIV (human immunodeficiency virus) testing. Early in your pregnancy, you will be screened for HIV. If you are at high risk for HIV, this test may   be repeated during your third trimester of pregnancy.  You may be offered other testing based on your age, personal or family medical history, or other factors. HOW OFTEN SHOULD I PLAN TO SEE MY  HEALTH CARE PROVIDER FOR PRENATAL CARE? Your prenatal care check-up schedule depends on any medical conditions you have before, or develop during, your pregnancy. If you do not have any underlying medical conditions, you will likely be seen for checkups:  Monthly, during the first 6 months of pregnancy.  Twice a month during months 7 and 8 of pregnancy.  Weekly starting in the 9th month of pregnancy and until delivery.  If you develop signs of early labor or other concerning signs or symptoms, you may need to see your health care provider more often. Ask your health care provider what prenatal care schedule is best for you. WHAT CAN I DO TO KEEP MYSELF AND MY BABY AS HEALTHY AS POSSIBLE DURING MY PREGNANCY?  Take a prenatal vitamin containing 400 micrograms (0.4 mg) of folic acid every day. Your health care provider may also ask you to take additional vitamins such as iodine, vitamin D, iron, copper, and zinc.  Take 1500-2000 mg of calcium daily starting at your 20th week of pregnancy until you deliver your baby.  Make sure you are up to date on your vaccinations. Unless directed otherwise by your health care provider: ? You should receive a tetanus, diphtheria, and pertussis (Tdap) vaccination between the 27th and 36th week of your pregnancy, regardless of when your last Tdap immunization occurred. This helps protect your baby from whooping cough (pertussis) after he or she is born. ? You should receive an annual inactivated influenza vaccine (IIV) to help protect you and your baby from influenza. This can be done at any point during your pregnancy.  Eat a well-rounded diet that includes: ? Fresh fruits and vegetables. ? Lean proteins. ? Calcium-rich foods such as milk, yogurt, hard cheeses, and dark, leafy greens. ? Whole grain breads.  Do noteat seafood high in mercury, including: ? Swordfish. ? Tilefish. ? Shark. ? King mackerel. ? More than 6 oz tuna per week.  Do not  eat: ? Raw or undercooked meats or eggs. ? Unpasteurized foods, such as soft cheeses (brie, blue, or feta), juices, and milks. ? Lunch meats. ? Hot dogs that have not been heated until they are steaming.  Drink enough water to keep your urine clear or pale yellow. For many women, this may be 10 or more 8 oz glasses of water each day. Keeping yourself hydrated helps deliver nutrients to your baby and may prevent the start of pre-term uterine contractions.  Do not use any tobacco products including cigarettes, chewing tobacco, or electronic cigarettes. If you need help quitting, ask your health care provider.  Do not drink beverages containing alcohol. No safe level of alcohol consumption during pregnancy has been determined.  Do not use any illegal drugs. These can harm your developing baby or cause a miscarriage.  Ask your health care provider or pharmacist before taking any prescription or over-the-counter medicines, herbs, or supplements.  Limit your caffeine intake to no more than 200 mg per day.  Exercise. Unless told otherwise by your health care provider, try to get 30 minutes of moderate exercise most days of the week. Do not  do high-impact activities, contact sports, or activities with a high risk of falling, such as horseback riding or downhill skiing.  Get plenty of rest.  Avoid anything that raises your  body temperature, such as hot tubs and saunas.  If you own a cat, do not empty its litter box. Bacteria contained in cat feces can cause an infection called toxoplasmosis. This can result in serious harm to the fetus.  Stay away from chemicals such as insecticides, lead, mercury, and cleaning or paint products that contain solvents.  Do not have any X-rays taken unless medically necessary.  Take a childbirth and breastfeeding preparation class. Ask your health care provider if you need a referral or recommendation.  This information is not intended to replace advice given  to you by your health care provider. Make sure you discuss any questions you have with your health care provider. Document Released: 11/12/2003 Document Revised: 04/13/2016 Document Reviewed: 01/24/2014 Elsevier Interactive Patient Education  2017 Elsevier Inc[employer] Eating Plan for Pregnant Women While you are pregnant, your body will require additional nutrition to help support your growing baby. It is recommended that you consume:  150 additional calories each day during your first trimester.  300 additional calories each day during your second trimester.  300 additional calories each day during your third trimester.  Eating a healthy, well-balanced diet is very important for your health and for your baby's health. You also have a higher need for some vitamins and minerals, such as folic acid, calcium, iron, and vitamin D. What do I need to know about eating during pregnancy?  Do not try to lose weight or go on a diet during pregnancy.  Choose healthy, nutritious foods. Choose  of a sandwich with a glass of milk instead of a candy bar or a high-calorie sugar-sweetened beverage.  Limit your overall intake of foods that have "empty calories." These are foods that have little nutritional value, such as sweets, desserts, candies, sugar-sweetened beverages, and fried foods.  Eat a variety of foods, especially fruits and vegetables.  Take a prenatal vitamin to help meet the additional needs during pregnancy, specifically for folic acid, iron, calcium, and vitamin D.  Remember to stay active. Ask your health care provider for exercise recommendations that are specific to you.  Practice good food safety and cleanliness, such as washing your hands before you eat and after you prepare raw meat. This helps to prevent foodborne illnesses, such as listeriosis, that can be very dangerous for your baby. Ask your health care provider for more information about listeriosis. What does 150 extra calories  look like? Healthy options for an additional 150 calories each day could be any of the following:  Plain low-fat yogurt (6-8 oz) with  cup of berries.  1 apple with 2 teaspoons of peanut butter.  Cut-up vegetables with  cup of hummus.  Low-fat chocolate milk (8 oz or 1 cup).  1 string cheese with 1 medium orange.   of a peanut butter and jelly sandwich on whole-wheat bread (1 tsp of peanut butter).  For 300 calories, you could eat two of those healthy options each day. What is a healthy amount of weight to gain? The recommended amount of weight for you to gain is based on your pre-pregnancy BMI. If your pre-pregnancy BMI was:  Less than 18 (underweight), you should gain 28-40 lb.  18-24.9 (normal), you should gain 25-35 lb.  25-29.9 (overweight), you should gain 15-25 lb.  Greater than 30 (obese), you should gain 11-20 lb.  What if I am having twins or multiples? Generally, pregnant women who will be having twins or multiples may need to increase their daily calories by 300-600 calories each   day. The recommended range for total weight gain is 25-54 lb, depending on your pre-pregnancy BMI. Talk with your health care provider for specific guidance about additional nutritional needs, weight gain, and exercise during your pregnancy. What foods can I eat? Grains Any grains. Try to choose whole grains, such as whole-wheat bread, oatmeal, or Muska rice. Vegetables Any vegetables. Try to eat a variety of colors and types of vegetables to get a full range of vitamins and minerals. Remember to wash your vegetables well before eating. Fruits Any fruits. Try to eat a variety of colors and types of fruit to get a full range of vitamins and minerals. Remember to wash your fruits well before eating. Meats and Other Protein Sources Lean meats, including chicken, [place], fish, and lean cuts of beef, veal, or pork. Make sure that all meats are cooked to "well done." Tofu. Tempeh. Beans. Eggs.  Peanut butter and other nut butters. Seafood, such as shrimp, crab, and lobster. If you choose fish, select types that are higher in omega-3 fatty acids, including salmon, herring, mussels, trout, sardines, and pollock. Make sure that all meats are cooked to food-safe temperatures. Dairy Pasteurized milk and milk alternatives. Pasteurized yogurt and pasteurized cheese. Cottage cheese. Sour cream. Beverages Water. Juices that contain 100% fruit juice or vegetable juice. Caffeine-free teas and decaffeinated coffee. Drinks that contain caffeine are okay to drink, but it is better to avoid caffeine. Keep your total caffeine intake to less than 200 mg each day (12 oz of coffee, tea, or soda) or as directed by your health care provider. Condiments Any pasteurized condiments. Sweets and Desserts Any sweets and desserts. Fats and Oils Any fats and oils. The items listed above may not be a complete list of recommended foods or beverages. Contact your dietitian for more options. What foods are not recommended? Vegetables Unpasteurized (raw) vegetable juices. Fruits Unpasteurized (raw) fruit juices. Meats and Other Protein Sources Cured meats that have nitrates, such as bacon, salami, and hotdogs. Luncheon meats, bologna, or other deli meats (unless they are reheated until they are steaming hot). Refrigerated pate, meat spreads from a meat counter, smoked seafood that is found in the refrigerated section of a store. Raw fish, such as sushi or sashimi. High mercury content fish, such as tilefish, shark, swordfish, and king mackerel. Raw meats, such as tuna or beef tartare. Undercooked meats and poultry. Make sure that all meats are cooked to food-safe temperatures. Dairy Unpasteurized (raw) milk and any foods that have raw milk in them. Soft cheeses, such as feta, queso blanco, queso fresco, Brie, Camembert cheeses, blue-veined cheeses, and Panela cheese (unless it is made with pasteurized milk, which must  be stated on the label). Beverages Alcohol. Sugar-sweetened beverages, such as sodas, teas, or energy drinks. Condiments Homemade fermented foods and drinks, such as pickles, sauerkraut, or kombucha drinks. (Store-bought pasteurized versions of these are okay.) Other Salads that are made in the store, such as ham salad, chicken salad, egg salad, tuna salad, and seafood salad. The items listed above may not be a complete list of foods and beverages to avoid. Contact your dietitian for more information. This information is not intended to replace advice given to you by your health care provider. Make sure you discuss any questions you have with your health care provider. Document Released: 08/24/2014 Document Revised: 04/16/2016 Document Reviewed: 04/24/2014 Elsevier Interactive Patient Education  [employer]2018 Elsevier Inc.

## 2018-10-14 NOTE — Progress Notes (Signed)
Subjective:    Julie Rowe is a 27 y.o. female who presents for evaluation of amenorrhea. She believes she could be pregnant. Pregnancy is desired. Sexual Activity: single partner, contraception: none. Current symptoms also include: fatigue. Last period was has history of PCOS.   Patient's last menstrual period was 08/23/2018 (exact date). The following portions of the patient's history were reviewed and updated as appropriate: allergies, current medications, past family history, past medical history, past social history, past surgical history and problem list.  Review of Systems Pertinent items are noted in HPI.     Objective:    BP 114/78   Pulse 78   Ht 5\' 6"  (1.676 m)   Wt 209 lb (94.8 kg)   LMP 08/23/2018 (Exact Date)   BMI 33.73 kg/m  General: alert, cooperative, appears stated age, no distress and no acute distress    Lab Review Urine HCG: positive    Assessment:    Absence of menstruation.     Plan:   Positive: EDC: 05/30/19 Briefly discussed pre-natal care options. Pregnancy, Childbirth. Discussed midwifery vs MD care . PT request to be midwife pt.  Encouraged well-balanced diet, plenty of rest when needed, pre-natal vitamins daily and walking for exercise. Discussed self-help for nausea, avoiding OTC medications until consulting provider or pharmacist, other than Tylenol as needed, minimal caffeine (1-2 cups daily) and avoiding alcohol. She will schedule her initial nurse visit @ 10 wks and NOB physical exam at 12 wks.  U/s for dating in 1-2 wks. Medication list given , information on genetic screening also given.   Doreene BurkeAnnie Fayrene Towner, CNM

## 2018-10-19 ENCOUNTER — Telehealth: Payer: Self-pay | Admitting: Obstetrics and Gynecology

## 2018-10-19 ENCOUNTER — Ambulatory Visit (INDEPENDENT_AMBULATORY_CARE_PROVIDER_SITE_OTHER): Payer: BLUE CROSS/BLUE SHIELD

## 2018-10-19 DIAGNOSIS — O3481 Maternal care for other abnormalities of pelvic organs, first trimester: Secondary | ICD-10-CM | POA: Diagnosis not present

## 2018-10-19 DIAGNOSIS — N8311 Corpus luteum cyst of right ovary: Secondary | ICD-10-CM

## 2018-10-19 DIAGNOSIS — N926 Irregular menstruation, unspecified: Secondary | ICD-10-CM

## 2018-10-27 ENCOUNTER — Other Ambulatory Visit: Payer: Self-pay | Admitting: Endocrinology

## 2018-11-03 ENCOUNTER — Other Ambulatory Visit: Payer: Self-pay | Admitting: Certified Nurse Midwife

## 2018-11-03 ENCOUNTER — Ambulatory Visit (INDEPENDENT_AMBULATORY_CARE_PROVIDER_SITE_OTHER): Payer: BLUE CROSS/BLUE SHIELD

## 2018-11-03 ENCOUNTER — Ambulatory Visit (INDEPENDENT_AMBULATORY_CARE_PROVIDER_SITE_OTHER): Payer: BLUE CROSS/BLUE SHIELD | Admitting: Certified Nurse Midwife

## 2018-11-03 ENCOUNTER — Other Ambulatory Visit: Payer: Self-pay | Admitting: Obstetrics and Gynecology

## 2018-11-03 VITALS — BP 112/75 | HR 92 | Wt 210.1 lb

## 2018-11-03 DIAGNOSIS — Z3687 Encounter for antenatal screening for uncertain dates: Secondary | ICD-10-CM

## 2018-11-03 DIAGNOSIS — Z3401 Encounter for supervision of normal first pregnancy, first trimester: Secondary | ICD-10-CM

## 2018-11-03 DIAGNOSIS — Z3201 Encounter for pregnancy test, result positive: Secondary | ICD-10-CM

## 2018-11-03 NOTE — Progress Notes (Signed)
Julie Rowe presents for NOB nurse interview visit. Pregnancy confirmation done 11/22/19_[facility].  G- 1.  P- 0   . Pregnancy education material explained and given. _0__ cats in the home. NOB labs ordered. (TSH/HbgA1c due to Increased BMI), HIV labs and Drug screen were explained optional and she did not decline. Drug screen ordered. PNV encouraged. Genetic screening options discussed. Genetic testing: Ordered.  Pt may discuss with provider. Pt. To follow up with provider in _3_ weeks for NOB physical. Appointment scheduled. All questions answered. FMLA papers signed and Financial policy explained with pt understanding. Husbands sister has Downs syndrome. Needs appointment next week for Panorama.

## 2018-11-04 LAB — URINALYSIS, ROUTINE W REFLEX MICROSCOPIC
Bilirubin, UA: NEGATIVE
Glucose, UA: NEGATIVE
Ketones, UA: NEGATIVE
Leukocytes, UA: NEGATIVE
Nitrite, UA: NEGATIVE
Protein, UA: NEGATIVE
RBC, UA: NEGATIVE
Specific Gravity, UA: <=1.005 — AB (ref 1.005–1.030)
Urobilinogen, Ur: 0.2 mg/dL (ref 0.2–1.0)
pH, UA: 6.5 (ref 5.0–7.5)

## 2018-11-04 LAB — TSH: TSH: 3.33 u[IU]/mL (ref 0.450–4.500)

## 2018-11-04 LAB — RPR: RPR Ser Ql: NONREACTIVE

## 2018-11-04 LAB — HIV ANTIBODY (ROUTINE TESTING W REFLEX): HIV Screen 4th Generation wRfx: NONREACTIVE

## 2018-11-04 LAB — RUBELLA SCREEN: Rubella Antibodies, IGG: 6.23 index (ref >0.99)

## 2018-11-04 LAB — VARICELLA ZOSTER ANTIBODY, IGG: Varicella zoster IgG: <135 index — ABNORMAL LOW (ref >165)

## 2018-11-04 LAB — ABO AND RH: Rh Factor: POSITIVE

## 2018-11-04 LAB — HEMOGLOBIN A1C
Est. average glucose Bld gHb Est-mCnc: 105 mg/dL
Hgb A1c MFr Bld: 5.3 % (ref 4.8–5.6)

## 2018-11-04 LAB — ANTIBODY SCREEN: Antibody Screen: NEGATIVE

## 2018-11-04 LAB — HEPATITIS B SURFACE ANTIGEN: Hepatitis B Surface Ag: NEGATIVE

## 2018-11-05 LAB — MONITOR DRUG PROFILE 14(MW)
Amphetamine Scrn, Ur: NEGATIVE ng/mL
BARBITURATE SCREEN URINE: NEGATIVE ng/mL
BENZODIAZEPINE SCREEN, URINE: NEGATIVE ng/mL
Buprenorphine, Urine: NEGATIVE ng/mL
CANNABINOIDS UR QL SCN: NEGATIVE ng/mL
COCAINE(METAB.)SCREEN, URINE: NEGATIVE ng/mL
Creatinine(Crt), U: 19.7 mg/dL — ABNORMAL LOW (ref 20.0–300.0)
Fentanyl, Urine: NEGATIVE pg/mL
MEPERIDINE SCREEN, URINE: NEGATIVE ng/mL
Methadone Screen, Urine: NEGATIVE ng/mL
OXYCODONE+OXYMORPHONE UR QL SCN: NEGATIVE ng/mL
Opiate Scrn, Ur: NEGATIVE ng/mL
PROPOXYPHENE SCREEN URINE: NEGATIVE ng/mL
Ph of Urine: 6.4 (ref 4.5–8.9)
Phencyclidine Qn, Ur: NEGATIVE ng/mL
SPECIFIC GRAVITY: 1.0025
TRAMADOL SCREEN, URINE: NEGATIVE ng/mL

## 2018-11-05 LAB — URINE CULTURE

## 2018-11-05 LAB — GC/CHLAMYDIA PROBE AMP
Chlamydia trachomatis, NAA: NEGATIVE
Neisseria gonorrhoeae by PCR: NEGATIVE

## 2018-11-05 LAB — NICOTINE SCREEN, URINE: Cotinine Ql Scrn, Ur: NEGATIVE ng/mL

## 2018-11-07 NOTE — Telephone Encounter (Signed)
error 

## 2018-11-11 ENCOUNTER — Other Ambulatory Visit: Payer: BLUE CROSS/BLUE SHIELD

## 2018-11-11 DIAGNOSIS — Z3401 Encounter for supervision of normal first pregnancy, first trimester: Secondary | ICD-10-CM

## 2018-11-12 LAB — CBC
Hematocrit: 39.9 % (ref 34.0–46.6)
Hemoglobin: 13.1 g/dL (ref 11.1–15.9)
MCH: 31 pg (ref 26.6–33.0)
MCHC: 32.8 g/dL (ref 31.5–35.7)
MCV: 94 fL (ref 79–97)
Platelets: 194 10*3/uL (ref 150–450)
RBC: 4.23 x10E6/uL (ref 3.77–5.28)
RDW: 12.3 % (ref 12.3–15.4)
WBC: 8.4 10*3/uL (ref 3.4–10.8)

## 2018-11-15 ENCOUNTER — Encounter: Payer: Self-pay | Admitting: Certified Nurse Midwife

## 2018-11-15 ENCOUNTER — Ambulatory Visit (INDEPENDENT_AMBULATORY_CARE_PROVIDER_SITE_OTHER): Payer: BLUE CROSS/BLUE SHIELD | Admitting: Certified Nurse Midwife

## 2018-11-15 VITALS — BP 108/71 | HR 74 | Wt 206.2 lb

## 2018-11-15 DIAGNOSIS — Z3403 Encounter for supervision of normal first pregnancy, third trimester: Secondary | ICD-10-CM | POA: Diagnosis not present

## 2018-11-15 DIAGNOSIS — Z3A36 36 weeks gestation of pregnancy: Secondary | ICD-10-CM | POA: Diagnosis not present

## 2018-11-15 DIAGNOSIS — Z3401 Encounter for supervision of normal first pregnancy, first trimester: Secondary | ICD-10-CM | POA: Diagnosis not present

## 2018-11-15 LAB — POCT URINALYSIS DIPSTICK OB
Bilirubin, UA: NEGATIVE
Blood, UA: NEGATIVE
Glucose, UA: NEGATIVE
KETONES UA: NEGATIVE
Leukocytes, UA: NEGATIVE
Nitrite, UA: NEGATIVE
POC,PROTEIN,UA: NEGATIVE
SPEC GRAV UA: 1.025 (ref 1.010–1.025)
Urobilinogen, UA: 0.2 E.U./dL
pH, UA: 6 (ref 5.0–8.0)

## 2018-11-15 NOTE — Progress Notes (Signed)
NEW OB HISTORY AND PHYSICAL  SUBJECTIVE:       Julie Rowe is a 27 y.o. G1P0000 female, Patient's last menstrual period was 08/23/2018 (exact date)., Estimated Date of Delivery: 06/13/19, [id number], presents today for establishment of Prenatal Care. She has no unusual complaints and complains of nuasea that is mild.       Gynecologic History Patient's last menstrual period was 08/23/2018 (exact date). Normal Contraception: none Last Pap: 12/31/2017 Results were: normal  Obstetric History OB History  Gravida Para Term Preterm AB Living  1 0 0 0 0 0  SAB TAB Ectopic Multiple Live Births  0 0 0 0 0    # Outcome Date GA Lbr Len/2nd Weight Sex Delivery Anes PTL Lv  1 Current             Past Medical History:  Diagnosis Date  . Hypothyroid   . Thyroid disease     Past Surgical History:  Procedure Laterality Date  . TONSILLECTOMY AND ADENOIDECTOMY      Current Outpatient Medications on File Prior to Visit  Medication Sig Dispense Refill  . cephALEXin (KEFLEX) 500 MG capsule Take 1 capsule (500 mg total) by mouth 3 (three) times daily. 30 capsule 0  . Prenat-Fe Carbonyl-FA-Omega 3 (ONE-A-DAY WOMENS PRENATAL 1 PO)     . Prenatal Vit-Fe Fumarate-FA (PRENATAL MULTIVITAMIN) TABS tablet Take 1 tablet by mouth daily at 12 noon.    Marland Kitchen. SYNTHROID 88 MCG tablet TAKE 1 TABLET BY MOUTH EVERY DAY 90 tablet 0   No current facility-administered medications on file prior to visit.     No Known Allergies  Social History   Socioeconomic History  . Marital status: Married    Spouse name: Not on file  . Number of children: Not on file  . Years of education: Not on file  . Highest education level: Not on file  Occupational History  . Not on file  Social Needs  . Financial resource strain: Not on file  . Food insecurity:    Worry: Not on file    Inability: Not on file  . Transportation needs:    Medical: Not on file    Non-medical: Not on file  Tobacco Use  . Smoking status: Never  Smoker  . Smokeless tobacco: Never Used  Substance and Sexual Activity  . Alcohol use: No    Frequency: Never  . Drug use: No  . Sexual activity: Yes    Birth control/protection: None  Lifestyle  . Physical activity:    Days per week: Not on file    Minutes per session: Not on file  . Stress: Not on file  Relationships  . Social connections:    Talks on phone: Not on file    Gets together: Not on file    Attends religious service: Not on file    Active member of club or organization: Not on file    Attends meetings of clubs or organizations: Not on file    Relationship status: Not on file  . Intimate partner violence:    Fear of current or ex partner: Not on file    Emotionally abused: Not on file    Physically abused: Not on file    Forced sexual activity: Not on file  Other Topics Concern  . Not on file  Social History Narrative  . Not on file    Family History  Problem Relation Age of Onset  . Diabetes Mother   . Thyroid disease Mother   .  Diabetes Brother   . Thyroid disease Brother   . Diabetes Maternal Grandmother   . Thyroid disease Paternal Grandmother     The following portions of the patient's history were reviewed and updated as appropriate: allergies, current medications, past OB history, past medical history, past surgical history, past family history, past social history, and problem list.    OBJECTIVE: Initial Physical Exam (New OB)  GENERAL APPEARANCE: alert, well appearing, in no apparent distress, oriented to person, place and time HEAD: normocephalic, atraumatic MOUTH: mucous membranes moist, pharynx normal without lesions THYROID: no thyromegaly or masses present BREASTS: no masses noted, no significant tenderness, no palpable axillary nodes, no skin changes LUNGS: clear to auscultation, no wheezes, rales or rhonchi, symmetric air entry HEART: regular rate and rhythm, no murmurs ABDOMEN: soft, nontender, nondistended, no abnormal masses, no  epigastric pain EXTREMITIES: no redness or tenderness in the calves or thighs, no edema, no limitation in range of motion, intact peripheral pulses SKIN: normal coloration and turgor, no rashes LYMPH NODES: no adenopathy palpable NEUROLOGIC: alert, oriented, normal speech, no focal findings or movement disorder noted  PELVIC EXAM EXTERNAL GENITALIA: normal appearing vulva with no masses, tenderness or lesions VAGINA: no abnormal discharge or lesions CERVIX: no lesions or cervical motion tenderness UTERUS: gravid ADNEXA: no masses palpable and nontender OB EXAM PELVIMETRY: appears adequate RECTUM: exam not indicated  ASSESSMENT: Normal pregnancy  PLAN: sNew OB counseling: The patient has been given an overview regarding routine prenatal care. Discussed weight gain of 11-20 lbs in pregnancy.Recommendations regarding diet, weight gain, and exercise in pregnancy were given. Prenatal testing, optional genetic testing, and ultrasound use in pregnancy were reviewed. Benefits of Breast Feeding were discussed. The patient is encouraged to consider nursing her baby post partum.  Pt is [redacted] wks pregnant FHT not auscultated today. She will return in [facility] wks [facility].   Julie Rowe, CNM   Julie Rowe, CNM

## 2018-11-15 NOTE — Patient Instructions (Signed)

## 2018-11-21 ENCOUNTER — Telehealth: Payer: Self-pay | Admitting: Certified Nurse Midwife

## 2018-11-21 ENCOUNTER — Telehealth: Payer: Self-pay

## 2018-11-21 NOTE — Telephone Encounter (Signed)
Informed pt not enough fetal DNA on Panorama. [facility] Pt was in agreement.

## 2018-11-21 NOTE — Telephone Encounter (Signed)
The patient called and stated that [facility] and would like a call back if possible today. Please advise.

## 2018-11-23 NOTE — L&D Delivery Note (Signed)
Delivery Note At  1530 a viable and healthy female was delivered via  (Presentation:ROA ;  ).  APGAR: 7, 9; weight pending  .   Placenta status: delivered intact with 3 vessel  Cord:  with the following complications: Banks x 1 infant delivered through after 3 1/2 hours of pushing  Anesthesia:  epidural Episiotomy:  none Lacerations:  2nd Suture Repair: 3.0 vicryl rapide Est. Blood Loss (mL):  750  Mom to postpartum.  Baby to Couplet care / Skin to Skin.  Melody N Shambley 06/20/2019, 3:51 PM

## 2018-11-30 ENCOUNTER — Encounter: Payer: Self-pay | Admitting: Certified Nurse Midwife

## 2018-11-30 ENCOUNTER — Ambulatory Visit (INDEPENDENT_AMBULATORY_CARE_PROVIDER_SITE_OTHER): Payer: BLUE CROSS/BLUE SHIELD | Admitting: Certified Nurse Midwife

## 2018-11-30 VITALS — BP 111/77 | HR 111 | Wt 207.0 lb

## 2018-11-30 DIAGNOSIS — Z3401 Encounter for supervision of normal first pregnancy, first trimester: Secondary | ICD-10-CM | POA: Diagnosis not present

## 2018-11-30 LAB — POCT URINALYSIS DIPSTICK OB
Bilirubin, UA: NEGATIVE
Blood, UA: NEGATIVE
Glucose, UA: NEGATIVE
Ketones, UA: NEGATIVE
Leukocytes, UA: NEGATIVE
Nitrite, UA: NEGATIVE
POC,PROTEIN,UA: NEGATIVE
Spec Grav, UA: 1.01 (ref 1.010–1.025)
Urobilinogen, UA: 0.2 E.U./dL
pH, UA: 5 (ref 5.0–8.0)

## 2018-11-30 NOTE — Progress Notes (Signed)
Body mass index is 33.41 kg/m. ROB doing well. Panorama testing redrawn today. Discussed early glucose screening due elevated BMI. Pt agrees and verblaizes understanding. Follow up 4 wks with Melody.   Doreene Burke, CNM

## 2019-01-04 ENCOUNTER — Other Ambulatory Visit: Payer: BLUE CROSS/BLUE SHIELD

## 2019-01-04 ENCOUNTER — Ambulatory Visit (INDEPENDENT_AMBULATORY_CARE_PROVIDER_SITE_OTHER): Payer: BLUE CROSS/BLUE SHIELD | Admitting: Certified Nurse Midwife

## 2019-01-04 VITALS — BP 120/83 | HR 97 | Wt 210.5 lb

## 2019-01-04 DIAGNOSIS — Z3492 Encounter for supervision of normal pregnancy, unspecified, second trimester: Secondary | ICD-10-CM | POA: Diagnosis not present

## 2019-01-04 DIAGNOSIS — Z3401 Encounter for supervision of normal first pregnancy, first trimester: Secondary | ICD-10-CM

## 2019-01-04 LAB — POCT URINALYSIS DIPSTICK OB
Bilirubin, UA: NEGATIVE
Blood, UA: NEGATIVE
Glucose, UA: NEGATIVE
Ketones, UA: NEGATIVE
Leukocytes, UA: NEGATIVE
NITRITE UA: NEGATIVE
POC,PROTEIN,UA: NEGATIVE
Spec Grav, UA: 1.015 (ref 1.010–1.025)
Urobilinogen, UA: 0.2 E.U./dL
pH, UA: 7 (ref 5.0–8.0)

## 2019-01-04 NOTE — Patient Instructions (Signed)

## 2019-01-04 NOTE — Progress Notes (Signed)
ROB- early glucose done today, pt is doing well

## 2019-01-04 NOTE — Progress Notes (Signed)
ROB doing well. Early glucose screen completed today. Discussed round ligament pain. Class list given to pt. Follow up 3 wks for anatomy scan and ROB with Melody.   Doreene Burke, CNM

## 2019-01-05 ENCOUNTER — Other Ambulatory Visit: Payer: BLUE CROSS/BLUE SHIELD

## 2019-01-05 ENCOUNTER — Encounter: Payer: BLUE CROSS/BLUE SHIELD | Admitting: Obstetrics and Gynecology

## 2019-01-05 LAB — GLUCOSE TOLERANCE, 1 HOUR: Glucose, 1Hr PP: 117 mg/dL (ref 65–199)

## 2019-01-22 ENCOUNTER — Other Ambulatory Visit: Payer: Self-pay | Admitting: Endocrinology

## 2019-01-24 ENCOUNTER — Other Ambulatory Visit: Payer: Self-pay

## 2019-01-24 MED ORDER — SYNTHROID 88 MCG PO TABS: 88.0000 ug | ORAL_TABLET | Freq: Every day | ORAL | 0 refills | Status: DC

## 2019-02-01 ENCOUNTER — Encounter: Payer: Self-pay | Admitting: Obstetrics and Gynecology

## 2019-02-01 ENCOUNTER — Other Ambulatory Visit: Payer: BLUE CROSS/BLUE SHIELD

## 2019-02-01 ENCOUNTER — Other Ambulatory Visit: Payer: Self-pay

## 2019-02-01 ENCOUNTER — Ambulatory Visit (INDEPENDENT_AMBULATORY_CARE_PROVIDER_SITE_OTHER): Payer: BLUE CROSS/BLUE SHIELD | Admitting: Obstetrics and Gynecology

## 2019-02-01 VITALS — BP 118/84 | HR 84 | Wt 218.5 lb

## 2019-02-01 DIAGNOSIS — Z3492 Encounter for supervision of normal pregnancy, unspecified, second trimester: Secondary | ICD-10-CM

## 2019-02-01 LAB — POCT URINALYSIS DIPSTICK OB
Bilirubin, UA: NEGATIVE
Blood, UA: NEGATIVE
Glucose, UA: NEGATIVE
Ketones, UA: NEGATIVE
LEUKOCYTES UA: NEGATIVE
Nitrite, UA: NEGATIVE
POC,PROTEIN,UA: NEGATIVE
Spec Grav, UA: 1.01 (ref 1.010–1.025)
Urobilinogen, UA: 0.2 E.U./dL
pH, UA: 6 (ref 5.0–8.0)

## 2019-02-01 NOTE — Progress Notes (Signed)
ROB- pt is doing well 

## 2019-02-01 NOTE — Progress Notes (Signed)
ROB- doing well, anatomy scan scheduled next week at [facility], has enrolled in some classes, still needs to enroll in childbirth and breast feeding

## 2019-02-06 ENCOUNTER — Ambulatory Visit: Payer: BLUE CROSS/BLUE SHIELD

## 2019-02-06 ENCOUNTER — Ambulatory Visit
Admission: RE | Admit: 2019-02-06 | Discharge: 2019-02-06 | Disposition: A | Discharge status: Home / Self Care | Payer: BLUE CROSS/BLUE SHIELD | Source: Ambulatory Visit | Attending: Certified Nurse Midwife | Admitting: Certified Nurse Midwife

## 2019-02-06 ENCOUNTER — Other Ambulatory Visit: Payer: Self-pay

## 2019-02-06 DIAGNOSIS — Z3492 Encounter for supervision of normal pregnancy, unspecified, second trimester: Secondary | ICD-10-CM | POA: Insufficient documentation

## 2019-02-13 ENCOUNTER — Other Ambulatory Visit: Payer: Self-pay | Admitting: Certified Nurse Midwife

## 2019-02-21 ENCOUNTER — Encounter: Payer: BLUE CROSS/BLUE SHIELD | Admitting: Certified Nurse Midwife

## 2019-02-22 ENCOUNTER — Other Ambulatory Visit: Payer: Self-pay

## 2019-02-22 MED ORDER — SYNTHROID 88 MCG PO TABS: 88.0000 ug | ORAL_TABLET | Freq: Every day | ORAL | 2 refills | Status: DC

## 2019-02-22 NOTE — Telephone Encounter (Signed)
Refill sent.

## 2019-02-23 ENCOUNTER — Telehealth: Payer: Self-pay

## 2019-02-23 ENCOUNTER — Telehealth: Payer: Self-pay | Admitting: Certified Nurse Midwife

## 2019-02-23 NOTE — Telephone Encounter (Signed)
The patient called and stated that she would like to speak with a nurse this morning if possible in regards to her being extremely dizzy for the past 20-30 minutes today. Please advise.

## 2019-02-23 NOTE — Telephone Encounter (Signed)
See mychart messages dated 02/23/19

## 2019-02-23 NOTE — Telephone Encounter (Signed)
See mychart message 02/23/19 11:10 AM

## 2019-03-08 ENCOUNTER — Telehealth: Payer: Self-pay | Admitting: *Deleted

## 2019-03-08 NOTE — Telephone Encounter (Signed)
Coronavirus (COVID-19) Are you at risk?  Are you at risk for the Coronavirus (COVID-19)?  To be considered HIGH RISK for Coronavirus (COVID-19), you have to meet the following criteria:  . Traveled to [place], [place], [place], [place] or [place]; or in the [place] to [place], [place], [place], or [place]; and have fever, cough, and shortness of breath within the last 2 weeks of travel OR . Been in close contact with a person diagnosed with COVID-19 within the last 2 weeks and have fever, cough, and shortness of breath . IF YOU DO NOT MEET THESE CRITERIA, YOU ARE CONSIDERED LOW RISK FOR COVID-19.  What to do if you are HIGH RISK for COVID-19?  . If you are having a medical emergency, call 911. . Seek medical care right away. Before you go to a doctor's office, urgent care or emergency department, call ahead and tell them about your recent travel, contact with someone diagnosed with COVID-19, and your symptoms. You should receive instructions from your physician's office regarding next steps of care.  . When you arrive at healthcare provider, tell the healthcare staff immediately you have returned from visiting [place], [place], [place], [place] or [place]; or traveled in the [place] to [place], [place], [place], or [place]; in the last two weeks or you have been in close contact with a person diagnosed with COVID-19 in the last 2 weeks.   . Tell the health care staff about your symptoms: fever, cough and shortness of breath. . After you have been seen by a medical provider, you will be either: o Tested for (COVID-19) and discharged home on quarantine except to seek medical care if symptoms worsen, and asked to  - Stay home and avoid contact with others until you get your results (4-5 days)  - Avoid travel on public transportation if possible (such as bus, train, or airplane) or o Sent to the Emergency Department by EMS for evaluation, COVID-19 testing, and possible  admission depending on your condition and test results.  What to do if you are LOW RISK for COVID-19?  Reduce your risk of any infection by using the same precautions used for avoiding the common cold or flu:  . Wash your hands often with soap and warm water for at least 20 seconds.  If soap and water are not readily available, use an alcohol-based hand sanitizer with at least 60% alcohol.  . If coughing or sneezing, cover your mouth and nose by coughing or sneezing into the elbow areas of your shirt or coat, into a tissue or into your sleeve (not your hands). . Avoid shaking hands with others and consider head nods or verbal greetings only. . Avoid touching your eyes, nose, or mouth with unwashed hands.  . Avoid close contact with people who are sick. . Avoid places or events with large numbers of people in one location, like concerts or sporting events. . Carefully consider travel plans you have or are making. . If you are planning any travel outside or inside the [place], visit the CDC's Travelers' Health webpage for the latest health notices. . If you have some symptoms but not all symptoms, continue to monitor at home and seek medical attention if your symptoms worsen. . If you are having a medical emergency, call 911.   ADDITIONAL HEALTHCARE OPTIONS FOR PATIENTS  [facility] / e-Visit: [email or web address]         [facility]: [phone]  [place]   [facility]: [phone]                   [facility]: [phone]   Spoke with pt denies any sx.  Judene Logue, CMA 

## 2019-03-09 ENCOUNTER — Other Ambulatory Visit: Payer: Self-pay

## 2019-03-09 ENCOUNTER — Ambulatory Visit (INDEPENDENT_AMBULATORY_CARE_PROVIDER_SITE_OTHER): Payer: BLUE CROSS/BLUE SHIELD | Admitting: Certified Nurse Midwife

## 2019-03-09 VITALS — BP 98/71 | HR 80 | Wt 220.1 lb

## 2019-03-09 DIAGNOSIS — Z3492 Encounter for supervision of normal pregnancy, unspecified, second trimester: Secondary | ICD-10-CM

## 2019-03-09 DIAGNOSIS — O2242 Hemorrhoids in pregnancy, second trimester: Secondary | ICD-10-CM

## 2019-03-09 DIAGNOSIS — Z23 Encounter for immunization: Secondary | ICD-10-CM | POA: Diagnosis not present

## 2019-03-09 LAB — POCT URINALYSIS DIPSTICK OB
Bilirubin, UA: NEGATIVE
Blood, UA: NEGATIVE
Glucose, UA: NEGATIVE
Ketones, UA: NEGATIVE
Leukocytes, UA: NEGATIVE
Nitrite, UA: NEGATIVE
POC,PROTEIN,UA: NEGATIVE
Spec Grav, UA: 1.015 (ref 1.010–1.025)
Urobilinogen, UA: 0.2 E.U./dL
pH, UA: 5 (ref 5.0–8.0)

## 2019-03-09 MED ORDER — TETANUS-DIPHTH-ACELL PERTUSSIS 5-2.5-18.5 LF-MCG/0.5 IM SUSP
0.5000 mL | Freq: Once | INTRAMUSCULAR | Status: AC
  Administered 2019-03-09: 0.5 mL via INTRAMUSCULAR

## 2019-03-09 NOTE — Progress Notes (Signed)
ROB-Reports hemorrhoids. Discussed home treatment. Third trimester handouts provided. Plans labor without epidural, breastfeeding, and circumcision.TDaP given. Labs today. Blood transfusion consent reviewed and signed. Anticipatory guidance regarding course of prenatal care. Reviewed red flag symptoms and when to call. RTC x 4 weeks for ROB or sooner if needed.

## 2019-03-09 NOTE — Patient Instructions (Signed)
Fetal Movement Counts Patient Name: ________________________________________________ Patient Due Date: ____________________ What is a fetal movement count?  A fetal movement count is the number of times that you feel your baby move during a certain amount of time. This may also be called a fetal kick count. A fetal movement count is recommended for every pregnant woman. You may be asked to start counting fetal movements as early as week 28 of your pregnancy. Pay attention to when your baby is most active. You may notice your baby's sleep and wake cycles. You may also notice things that make your baby move more. You should do a fetal movement count:  When your baby is normally most active.  At the same time each day. A good time to count movements is while you are resting, after having something to eat and drink. How do I count fetal movements? 1. Find a quiet, comfortable area. Sit, or lie down on your side. 2. Write down the date, the start time and stop time, and the number of movements that you felt between those two times. Take this information with you to your health care visits. 3. For 2 hours, count kicks, flutters, swishes, rolls, and jabs. You should feel at least 10 movements during 2 hours. 4. You may stop counting after you have felt 10 movements. 5. If you do not feel 10 movements in 2 hours, have something to eat and drink. Then, keep resting and counting for 1 hour. If you feel at least 4 movements during that hour, you may stop counting. Contact a health care provider if:  You feel fewer than 4 movements in 2 hours.  Your baby is not moving like he or she usually does. Date: ____________ Start time: ____________ Stop time: ____________ Movements: ____________ Date: ____________ Start time: ____________ Stop time: ____________ Movements: ____________ Date: ____________ Start time: ____________ Stop time: ____________ Movements: ____________ Date: ____________ Start time:  ____________ Stop time: ____________ Movements: ____________ Date: ____________ Start time: ____________ Stop time: ____________ Movements: ____________ Date: ____________ Start time: ____________ Stop time: ____________ Movements: ____________ Date: ____________ Start time: ____________ Stop time: ____________ Movements: ____________ Date: ____________ Start time: ____________ Stop time: ____________ Movements: ____________ Date: ____________ Start time: ____________ Stop time: ____________ Movements: ____________ This information is not intended to replace advice given to you by your health care provider. Make sure you discuss any questions you have with your health care provider. Document Released: 12/09/2006 Document Revised: 07/08/2016 Document Reviewed: 12/19/2015 Elsevier Interactive Patient Education  2019 [employer]. [facility]  [place], [place], [place] [address]  Phone: [phone]   [facility] (second location)  [address] [place], [place] [address]  Phone: [phone]   [facility] [place], [place], [place] [address] Phone: [phone]   [facility] [address] [place], [place] [address]  Phone: [phone] Relief During Labor and Delivery Many things can cause pain during labor and delivery, including:  Pressure on bones and ligaments due to the baby moving through the pelvis.  Stretching of tissues due to the baby moving through the birth canal.  Muscle tension due to anxiety or nervousness.  The uterus tightening (contracting) and relaxing to help move the baby. There are many ways to deal with the pain of labor and delivery. They include:  Taking prenatal classes. Taking these classes helps you know what to expect during your baby's birth. What you learn will increase your  confidence and decrease your anxiety.  Practicing relaxation  techniques or doing relaxing activities, such as: ? Focused breathing. ? Meditation. ? Visualization. ? Aroma therapy. ? Listening to your favorite music. ? Hypnosis.  Taking a warm shower or bath (hydrotherapy). This may: ? Provide comfort and relaxation. ? Lessen your perception of pain. ? Decrease the amount of pain medicine needed. ? Decrease the length of labor.  Getting a massage or counterpressure on your back.  Applying warm packs or ice packs.  Changing positions often, moving around, or using a birthing ball.  Getting: ? Pain medicine through an IV or injection into a muscle. ? Pain medicine inserted into your spinal column. ? Injections of sterile water just under the skin on your lower back (intradermal injections). ? Laughing gas (nitrous oxide). Discuss your pain control options with your health care provider during your prenatal visits. Explore the options offered by your hospital or birth center. What kinds of medicine are available? There are two kinds of medicines that can be used to relieve pain during labor and delivery:  Analgesics. These medicines decrease pain without causing you to lose feeling or the ability to move your muscles.  Anesthetics. These medicines block feeling in the body and can decrease your ability to move freely. Both of these kinds of medicine can cause minor side effects, such as nausea, trouble concentrating, and sleepiness. They can also decrease the baby's heart rate before birth and affect the baby's breathing rate after birth. For this reason, health care providers are careful about when and how much medicine is given. What are specific medicines and procedures that provide pain relief? Local Anesthetics Local anesthetics are used to numb a small area of the body. They may be used along with another kind of anesthetic or used to numb the nerves of the vagina, cervix, and perineum during the second stage of labor. General Anesthetics  General anesthetics cause you to lose consciousness so you do not feel pain. They are usually only used for an emergency cesarean delivery. General anesthetics are given through an IV tube and a mask. Pudendal Block A pudendal block is a form of local anesthetic. It may be used to relieve the pain associated with pushing or stretching of the perineum at the time of delivery or to further numb the perineum. A pudendal block is done by injecting numbing medicine through the vaginal wall into a nerve in the pelvis. Epidural Analgesia Epidural analgesia is given through a flexible IV catheter that is inserted into the lower back. Numbing medicine is delivered continuously to the area near your spinal column nerves (epidural space). After having this type of analgesia, you may be able to move your legs but you most likely will not be able to walk. Depending on the amount of medicine given, you may lose all feeling in the lower half of your body, or you may retain some level of sensation, including the urge to push. Epidural analgesia can be used to provide pain relief for a vaginal birth. Spinal Block A spinal block is similar to epidural analgesia, but the medicine is injected into the spinal fluid instead of the epidural space. A spinal block is only given once. It starts to relieve pain quickly, but the pain relief lasts only 1-6 hours. Spinal blocks can be used for cesarean deliveries. Combined Spinal-Epidural (CSE) Block A CSE block combines the effects of a spinal block and epidural analgesia. The spinal block works quickly to block all pain. The  epidural analgesia provides continuous pain relief, even after the effects of the spinal block have worn off. This information is not intended to replace advice given to you by your health care provider. Make sure you discuss any questions you have with your health care provider. Document Released: 02/25/2009 Document Revised: 04/17/2016 Document Reviewed:  04/01/2016 Elsevier Interactive Patient Education  2019 [employer]. Breastfeeding  Choosing to breastfeed is one of the best decisions you can make for yourself and your baby. A change in hormones during pregnancy causes your breasts to make breast milk in your milk-producing glands. Hormones prevent breast milk from being released before your baby is born. They also prompt milk flow after birth. Once breastfeeding has begun, thoughts of your baby, as well as his or her sucking or crying, can stimulate the release of milk from your milk-producing glands. Benefits of breastfeeding Research shows that breastfeeding offers many health benefits for infants and mothers. It also offers a cost-free and convenient way to feed your baby. For your baby  Your first milk (colostrum) helps your baby's digestive system to function better.  Special cells in your milk (antibodies) help your baby to fight off infections.  Breastfed babies are less likely to develop asthma, allergies, obesity, or type 2 diabetes. They are also at lower risk for sudden infant death syndrome (SIDS).  Nutrients in breast milk are better able to meet your baby's needs compared to infant formula.  Breast milk improves your baby's brain development. For you  Breastfeeding helps to create a very special bond between you and your baby.  Breastfeeding is convenient. Breast milk costs nothing and is always available at the correct temperature.  Breastfeeding helps to burn calories. It helps you to lose the weight that you gained during pregnancy.  Breastfeeding makes your uterus return faster to its size before pregnancy. It also slows bleeding (lochia) after you give birth.  Breastfeeding helps to lower your risk of developing type 2 diabetes, osteoporosis, rheumatoid arthritis, cardiovascular disease, and breast, ovarian, uterine, and endometrial cancer later in life. Breastfeeding basics Starting breastfeeding  Find a  comfortable place to sit or lie down, with your neck and back well-supported.  Place a pillow or a rolled-up blanket under your baby to bring him or her to the level of your breast (if you are seated). Nursing pillows are specially designed to help support your arms and your baby while you breastfeed.  Make sure that your baby's tummy (abdomen) is facing your abdomen.  Gently massage your breast. With your fingertips, massage from the outer edges of your breast inward toward the nipple. This encourages milk flow. If your milk flows slowly, you may need to continue this action during the feeding.  Support your breast with 4 fingers underneath and your thumb above your nipple (make the letter "C" with your hand). Make sure your fingers are well away from your nipple and your baby's mouth.  Stroke your baby's lips gently with your finger or nipple.  When your baby's mouth is open wide enough, quickly bring your baby to your breast, placing your entire nipple and as much of the areola as possible into your baby's mouth. The areola is the colored area around your nipple. ? More areola should be visible above your baby's upper lip than below the lower lip. ? Your baby's lips should be opened and extended outward (flanged) to ensure an adequate, comfortable latch. ? Your baby's tongue should be between his or her lower gum and  your breast.  Make sure that your baby's mouth is correctly positioned around your nipple (latched). Your baby's lips should create a seal on your breast and be turned out (everted).  It is common for your baby to suck about 2-3 minutes in order to start the flow of breast milk. Latching Teaching your baby how to latch onto your breast properly is very important. An improper latch can cause nipple pain, decreased milk supply, and poor weight gain in your baby. Also, if your baby is not latched onto your nipple properly, he or she may swallow some air during feeding. This can make  your baby fussy. Burping your baby when you switch breasts during the feeding can help to get rid of the air. However, teaching your baby to latch on properly is still the best way to prevent fussiness from swallowing air while breastfeeding. Signs that your baby has successfully latched onto your nipple  Silent tugging or silent sucking, without causing you pain. Infant's lips should be extended outward (flanged).  Swallowing heard between every 3-4 sucks once your milk has started to flow (after your let-down milk reflex occurs).  Muscle movement above and in front of his or her ears while sucking. Signs that your baby has not successfully latched onto your nipple  Sucking sounds or smacking sounds from your baby while breastfeeding.  Nipple pain. If you think your baby has not latched on correctly, slip your finger into the corner of your baby's mouth to break the suction and place it between your baby's gums. Attempt to start breastfeeding again. Signs of successful breastfeeding Signs from your baby  Your baby will gradually decrease the number of sucks or will completely stop sucking.  Your baby will fall asleep.  Your baby's body will relax.  Your baby will retain a small amount of milk in his or her mouth.  Your baby will let go of your breast by himself or herself. Signs from you  Breasts that have increased in firmness, weight, and size 1-3 hours after feeding.  Breasts that are softer immediately after breastfeeding.  Increased milk volume, as well as a change in milk consistency and color by the fifth day of breastfeeding.  Nipples that are not sore, cracked, or bleeding. Signs that your baby is getting enough milk  Wetting at least 1-2 diapers during the first 24 hours after birth.  Wetting at least 5-6 diapers every 24 hours for the first week after birth. The urine should be clear or pale yellow by the age of 5 days.  Wetting 6-8 diapers every 24 hours as your  baby continues to grow and develop.  At least 3 stools in a 24-hour period by the age of 5 days. The stool should be soft and yellow.  At least 3 stools in a 24-hour period by the age of 7 days. The stool should be seedy and yellow.  No loss of weight greater than 10% of birth weight during the first 3 days of life.  Average weight gain of 4-7 oz (113-198 g) per week after the age of 4 days.  Consistent daily weight gain by the age of 5 days, without weight loss after the age of 2 weeks. After a feeding, your baby may spit up a small amount of milk. This is normal. Breastfeeding frequency and duration Frequent feeding will help you make more milk and can prevent sore nipples and extremely full breasts (breast engorgement). Breastfeed when you feel the need to reduce  the fullness of your breasts or when your baby shows signs of hunger. This is called "breastfeeding on demand." Signs that your baby is hungry include:  Increased alertness, activity, or restlessness.  Movement of the head from side to side.  Opening of the mouth when the corner of the mouth or cheek is stroked (rooting).  Increased sucking sounds, smacking lips, cooing, sighing, or squeaking.  Hand-to-mouth movements and sucking on fingers or hands.  Fussing or crying. Avoid introducing a pacifier to your baby in the first 4-6 weeks after your baby is born. After this time, you may choose to use a pacifier. Research has shown that pacifier use during the first year of a baby's life decreases the risk of sudden infant death syndrome (SIDS). Allow your baby to feed on each breast as long as he or she wants. When your baby unlatches or falls asleep while feeding from the first breast, offer the second breast. Because newborns are often sleepy in the first few weeks of life, you may need to awaken your baby to get him or her to feed. Breastfeeding times will vary from baby to baby. However, the following rules can serve as a  guide to help you make sure that your baby is properly fed:  Newborns (babies 60 weeks of age or younger) may breastfeed every 1-3 hours.  Newborns should not go without breastfeeding for longer than 3 hours during the day or 5 hours during the night.  You should breastfeed your baby a minimum of 8 times in a 24-hour period. Breast milk pumping     Pumping and storing breast milk allows you to make sure that your baby is exclusively fed your breast milk, even at times when you are unable to breastfeed. This is especially important if you go back to work while you are still breastfeeding, or if you are not able to be present during feedings. Your lactation consultant can help you find a method of pumping that works best for you and give you guidelines about how long it is safe to store breast milk. Caring for your breasts while you breastfeed Nipples can become dry, cracked, and sore while breastfeeding. The following recommendations can help keep your breasts moisturized and healthy:  Avoid using soap on your nipples.  Wear a supportive bra designed especially for nursing. Avoid wearing underwire-style bras or extremely tight bras (sports bras).  Air-dry your nipples for 3-4 minutes after each feeding.  Use only cotton bra pads to absorb leaked breast milk. Leaking of breast milk between feedings is normal.  Use lanolin on your nipples after breastfeeding. Lanolin helps to maintain your skin's normal moisture barrier. Pure lanolin is not harmful (not toxic) to your baby. You may also hand express a few drops of breast milk and gently massage that milk into your nipples and allow the milk to air-dry. In the first few weeks after giving birth, some women experience breast engorgement. Engorgement can make your breasts feel heavy, warm, and tender to the touch. Engorgement peaks within 3-5 days after you give birth. The following recommendations can help to ease engorgement:  Completely empty  your breasts while breastfeeding or pumping. You may want to start by applying warm, moist heat (in the shower or with warm, water-soaked hand towels) just before feeding or pumping. This increases circulation and helps the milk flow. If your baby does not completely empty your breasts while breastfeeding, pump any extra milk after he or she is finished.  Apply  ice packs to your breasts immediately after breastfeeding or pumping, unless this is too uncomfortable for you. To do this: ? Put ice in a plastic bag. ? Place a towel between your skin and the bag. ? Leave the ice on for 20 minutes, 2-3 times a day.  Make sure that your baby is latched on and positioned properly while breastfeeding. If engorgement persists after 48 hours of following these recommendations, contact your health care provider or a Science writer. Overall health care recommendations while breastfeeding  Eat 3 healthy meals and 3 snacks every day. Well-nourished mothers who are breastfeeding need an additional 450-500 calories a day. You can meet this requirement by increasing the amount of a balanced diet that you eat.  Drink enough water to keep your urine pale yellow or clear.  Rest often, relax, and continue to take your prenatal vitamins to prevent fatigue, stress, and low vitamin and mineral levels in your body (nutrient deficiencies).  Do not use any products that contain nicotine or tobacco, such as cigarettes and e-cigarettes. Your baby may be harmed by chemicals from cigarettes that pass into breast milk and exposure to secondhand smoke. If you need help quitting, ask your health care provider.  Avoid alcohol.  Do not use illegal drugs or marijuana.  Talk with your health care provider before taking any medicines. These include over-the-counter and prescription medicines as well as vitamins and herbal supplements. Some medicines that may be harmful to your baby can pass through breast milk.  It is possible  to become pregnant while breastfeeding. If birth control is desired, ask your health care provider about options that will be safe while breastfeeding your baby. Where to find more information: [employer]: www.llli.org Contact a health care provider if:  You feel like you want to stop breastfeeding or have become frustrated with breastfeeding.  Your nipples are cracked or bleeding.  Your breasts are red, tender, or warm.  You have: ? Painful breasts or nipples. ? A swollen area on either breast. ? A fever or chills. ? Nausea or vomiting. ? Drainage other than breast milk from your nipples.  Your breasts do not become full before feedings by the fifth day after you give birth.  You feel sad and depressed.  Your baby is: ? Too sleepy to eat well. ? Having trouble sleeping. ? More than 19 week old and wetting fewer than 6 diapers in a 24-hour period. ? Not gaining weight by 28 days of age.  Your baby has fewer than 3 stools in a 24-hour period.  Your baby's skin or the white parts of his or her eyes become yellow. Get help right away if:  Your baby is overly tired (lethargic) and does not want to wake up and feed.  Your baby develops an unexplained fever. Summary  Breastfeeding offers many health benefits for infant and mothers.  Try to breastfeed your infant when he or she shows early signs of hunger.  Gently tickle or stroke your baby's lips with your finger or nipple to allow the baby to open his or her mouth. Bring the baby to your breast. Make sure that much of the areola is in your baby's mouth. Offer one side and burp the baby before you offer the other side.  Talk with your health care provider or lactation consultant if you have questions or you face problems as you breastfeed. This information is not intended to replace advice given to you by your health care  provider. Make sure you discuss any questions you have with your health care provider.  Document Released: 11/09/2005 Document Revised: 12/11/2016 Document Reviewed: 12/11/2016 Elsevier Interactive Patient Education  2019 [employer]. Common Medications Safe in Pregnancy  Acne:      Constipation:  Benzoyl Peroxide     Colace  Clindamycin      Dulcolax Suppository  Topica Erythromycin     Fibercon  Salicylic Acid      Metamucil         Miralax AVOID:        Senakot   Accutane    Cough:  Retin-A       Cough Drops  Tetracycline      Phenergan w/ Codeine if Rx  Minocycline      Robitussin (Plain & DM)  Antibiotics:     Crabs/Lice:  Ceclor       RID  Cephalosporins    AVOID:  E-Mycins      Kwell  Keflex  Macrobid/Macrodantin   Diarrhea:  Penicillin      Kao-Pectate  Zithromax      Imodium AD         PUSH FLUIDS AVOID:       Cipro     Fever:  Tetracycline      Tylenol (Regular or Extra  Minocycline       Strength)  Levaquin      Extra Strength-Do not          Exceed 8 tabs/24 hrs Caffeine:        <260m/day (equiv. To 1 cup of coffee or  approx. 3 12 oz sodas)         Gas: Cold/Hayfever:       Gas-X  Benadryl      Mylicon  Claritin       Phazyme  **Claritin-D        Chlor-Trimeton    Headaches:  Dimetapp      ASA-Free Excedrin  Drixoral-Non-Drowsy     Cold Compress  Mucinex (Guaifenasin)     Tylenol (Regular or Extra  Sudafed/Sudafed-12 Hour     Strength)  **Sudafed PE Pseudoephedrine   Tylenol Cold & Sinus     Vicks Vapor Rub  Zyrtec  **AVOID if Problems With Blood Pressure         Heartburn: Avoid lying down for at least 1 hour after meals  Aciphex      Maalox     Rash:  Milk of Magnesia     Benadryl    Mylanta       1% Hydrocortisone Cream  Pepcid  Pepcid Complete   Sleep Aids:  Prevacid      Ambien   Prilosec       Benadryl  Rolaids       Chamomile Tea  Tums (Limit 4/day)     Unisom  Zantac       Tylenol PM         Warm milk-add vanilla or  Hemorrhoids:       Sugar for taste  Anusol/Anusol H.C.  (RX: Analapram 2.5%)  Sugar  Substitutes:  Hydrocortisone OTC     Ok in moderation  Preparation H      Tucks        Vaseline lotion applied to tissue with wiping    Herpes:     Throat:  Acyclovir      Oragel  Famvir  Valtrex     Vaccines:  Flu Shot Leg Cramps:       *Gardasil  Benadryl      Hepatitis A         Hepatitis B Nasal Spray:       Pneumovax  Saline Nasal Spray     Polio Booster         Tetanus Nausea:       Tuberculosis test or PPD  Vitamin B6 25 mg TID   AVOID:    Dramamine      *Gardasil  Emetrol       Live Poliovirus  Ginger Root 250 mg QID    MMR (measles, mumps &  High Complex Carbs @ Bedtime    rebella)  Sea Bands-Accupressure    Varicella (Chickenpox)  Unisom 1/2 tab TID     *No known complications           If received before Pain:         Known pregnancy;   Darvocet       Resume series after  Lortab        Delivery  Percocet    Yeast:   Tramadol      Femstat  Tylenol 3      Gyne-lotrimin  Ultram       Monistat  Vicodin           MISC:         All Sunscreens           Hair Coloring/highlights          Insect Repellant's          (Including DEET)         [place] [employer] Third Trimester of Pregnancy  The third trimester is from week 28 through week 40 (months 7 through 9). This trimester is when your unborn baby (fetus) is growing very fast. At the end of the ninth month, the unborn baby is about 20 inches in length. It weighs about 6-10 pounds. Follow these instructions at home: Medicines  Take over-the-counter and prescription medicines only as told by your doctor. Some medicines are safe and some medicines are not safe during pregnancy.  Take a prenatal vitamin that contains at least 600 micrograms (mcg) of folic acid.  If you have trouble pooping (constipation), take medicine that will make your stool soft (stool softener) if your doctor approves. Eating and drinking   Eat regular, healthy meals.  Avoid raw meat and uncooked cheese.  If you get low calcium from the  food you eat, talk to your doctor about taking a daily calcium supplement.  Eat four or five small meals rather than three large meals a day.  Avoid foods that are high in fat and sugars, such as fried and sweet foods.  To prevent constipation: ? Eat foods that are high in fiber, like fresh fruits and vegetables, whole grains, and beans. ? Drink enough fluids to keep your pee (urine) clear or pale yellow. Activity  Exercise only as told by your doctor. Stop exercising if you start to have cramps.  Avoid heavy lifting, wear low heels, and sit up straight.  Do not exercise if it is too hot, too humid, or if you are in a place of great height (high altitude).  You may continue to have sex unless your doctor tells you not to. Relieving pain and discomfort  Wear a good support bra if your breasts are tender.  Take frequent breaks and rest with your legs raised if you have  leg cramps or low back pain.  Take warm water baths (sitz baths) to soothe pain or discomfort caused by hemorrhoids. Use hemorrhoid cream if your doctor approves.  If you develop puffy, bulging veins (varicose veins) in your legs: ? Wear support hose or compression stockings as told by your doctor. ? Raise (elevate) your feet for 15 minutes, 3-4 times a day. ? Limit salt in your food. Safety  Wear your seat belt when driving.  Make a list of emergency phone numbers, including numbers for family, friends, the hospital, and police and fire departments. Preparing for your baby's arrival To prepare for the arrival of your baby:  Take prenatal classes.  Practice driving to the hospital.  Visit the hospital and tour the maternity area.  Talk to your work about taking leave once the baby comes.  Pack your hospital bag.  Prepare the baby's room.  Go to your doctor visits.  Buy a rear-facing car seat. Learn how to install it in your car. General instructions  Do not use hot tubs, steam rooms, or saunas.   Do not use any products that contain nicotine or tobacco, such as cigarettes and e-cigarettes. If you need help quitting, ask your doctor.  Do not drink alcohol.  Do not douche or use tampons or scented sanitary pads.  Do not cross your legs for long periods of time.  Do not travel for long distances unless you must. Only do so if your doctor says it is okay.  Visit your dentist if you have not gone during your pregnancy. Use a soft toothbrush to brush your teeth. Be gentle when you floss.  Avoid cat litter boxes and soil used by cats. These carry germs that can cause birth defects in the baby and can cause a loss of your baby (miscarriage) or stillbirth.  Keep all your prenatal visits as told by your doctor. This is important. Contact a doctor if:  You are not sure if you are in labor or if your water has broken.  You are dizzy.  You have mild cramps or pressure in your lower belly.  You have a nagging pain in your belly area.  You continue to feel sick to your stomach, you throw up, or you have watery poop.  You have bad smelling fluid coming from your vagina.  You have pain when you pee. Get help right away if:  You have a fever.  You are leaking fluid from your vagina.  You are spotting or bleeding from your vagina.  You have severe belly cramps or pain.  You lose or gain weight quickly.  You have trouble catching your breath and have chest pain.  You notice sudden or extreme puffiness (swelling) of your face, hands, ankles, feet, or legs.  You have not felt the baby move in over an hour.  You have severe headaches that do not go away with medicine.  You have trouble seeing.  You are leaking, or you are having a gush of fluid, from your vagina before you are 37 weeks.  You have regular belly spasms (contractions) before you are 37 weeks. Summary  The third trimester is from week 28 through week 40 (months 7 through 9). This time is when your unborn baby is  growing very fast.  Follow your doctor's advice about medicine, food, and activity.  Get ready for the arrival of your baby by taking prenatal classes, getting all the baby items ready, preparing the baby's room, and visiting your  doctor to be checked.  Get help right away if you are bleeding from your vagina, or you have chest pain and trouble catching your breath, or if you have not felt your baby move in over an hour. This information is not intended to replace advice given to you by your health care provider. Make sure you discuss any questions you have with your health care provider. Document Released: 02/03/2010 Document Revised: 12/15/2016 Document Reviewed: 12/15/2016 Elsevier Interactive Patient Education  2019 [employer].

## 2019-03-10 LAB — CBC
Hematocrit: 36.4 % (ref 34.0–46.6)
Hemoglobin: 12 g/dL (ref 11.1–15.9)
MCH: 31.7 pg (ref 26.6–33.0)
MCHC: 33 g/dL (ref 31.5–35.7)
MCV: 96 fL (ref 79–97)
Platelets: 178 10*3/uL (ref 150–450)
RBC: 3.78 x10E6/uL (ref 3.77–5.28)
RDW: 12.6 % (ref 11.7–15.4)
WBC: 10.4 10*3/uL (ref 3.4–10.8)

## 2019-03-10 LAB — GLUCOSE, 1 HOUR GESTATIONAL: Gestational Diabetes Screen: 106 mg/dL (ref 65–139)

## 2019-03-10 LAB — RPR: RPR Ser Ql: NONREACTIVE

## 2019-04-05 ENCOUNTER — Telehealth: Payer: Self-pay | Admitting: *Deleted

## 2019-04-05 NOTE — Telephone Encounter (Signed)
Coronavirus (COVID-19) Are you at risk?  Are you at risk for the Coronavirus (COVID-19)?  To be considered HIGH RISK for Coronavirus (COVID-19), you have to meet the following criteria:  . Traveled to [place], [place], [place], [place] or [place]; or in the [place] to [place], [place], [place], or [place]; and have fever, cough, and shortness of breath within the last 2 weeks of travel OR . Been in close contact with a person diagnosed with COVID-19 within the last 2 weeks and have fever, cough, and shortness of breath . IF YOU DO NOT MEET THESE CRITERIA, YOU ARE CONSIDERED LOW RISK FOR COVID-19.  What to do if you are HIGH RISK for COVID-19?  . If you are having a medical emergency, call 911. . Seek medical care right away. Before you go to a doctor's office, urgent care or emergency department, call ahead and tell them about your recent travel, contact with someone diagnosed with COVID-19, and your symptoms. You should receive instructions from your physician's office regarding next steps of care.  . When you arrive at healthcare provider, tell the healthcare staff immediately you have returned from visiting [place], [place], [place], [place] or [place]; or traveled in the [place] to [place], [place], [place], or [place]; in the last two weeks or you have been in close contact with a person diagnosed with COVID-19 in the last 2 weeks.   . Tell the health care staff about your symptoms: fever, cough and shortness of breath. . After you have been seen by a medical provider, you will be either: o Tested for (COVID-19) and discharged home on quarantine except to seek medical care if symptoms worsen, and asked to  - Stay home and avoid contact with others until you get your results (4-5 days)  - Avoid travel on public transportation if possible (such as bus, train, or airplane) or o Sent to the Emergency Department by EMS for evaluation, COVID-19 testing, and possible  admission depending on your condition and test results.  What to do if you are LOW RISK for COVID-19?  Reduce your risk of any infection by using the same precautions used for avoiding the common cold or flu:  . Wash your hands often with soap and warm water for at least 20 seconds.  If soap and water are not readily available, use an alcohol-based hand sanitizer with at least 60% alcohol.  . If coughing or sneezing, cover your mouth and nose by coughing or sneezing into the elbow areas of your shirt or coat, into a tissue or into your sleeve (not your hands). . Avoid shaking hands with others and consider head nods or verbal greetings only. . Avoid touching your eyes, nose, or mouth with unwashed hands.  . Avoid close contact with people who are sick. . Avoid places or events with large numbers of people in one location, like concerts or sporting events. . Carefully consider travel plans you have or are making. . If you are planning any travel outside or inside the [place], visit the CDC's Travelers' Health webpage for the latest health notices. . If you have some symptoms but not all symptoms, continue to monitor at home and seek medical attention if your symptoms worsen. . If you are having a medical emergency, call 911.   ADDITIONAL HEALTHCARE OPTIONS FOR PATIENTS  [facility] / e-Visit: [email or web address]         [facility]: [phone]  [facility]: [phone]                   [facility]: [phone]   Spoke with pt denies any sx.   , CMA 

## 2019-04-06 ENCOUNTER — Telehealth: Payer: Self-pay

## 2019-04-06 NOTE — Telephone Encounter (Signed)
Coronavirus (COVID-19) Are you at risk?  Are you at risk for the Coronavirus (COVID-19)?  To be considered HIGH RISK for Coronavirus (COVID-19), you have to meet the following criteria:  . Traveled to [place], [place], [place], [place] or [place]; or in the [place] to [place], [place], [place], or [place]; and have fever, cough, and shortness of breath within the last 2 weeks of travel OR . Been in close contact with a person diagnosed with COVID-19 within the last 2 weeks and have fever, cough, and shortness of breath . IF YOU DO NOT MEET THESE CRITERIA, YOU ARE CONSIDERED LOW RISK FOR COVID-19.  What to do if you are HIGH RISK for COVID-19?  . If you are having a medical emergency, call 911. . Seek medical care right away. Before you go to a doctor's office, urgent care or emergency department, call ahead and tell them about your recent travel, contact with someone diagnosed with COVID-19, and your symptoms. You should receive instructions from your physician's office regarding next steps of care.  . When you arrive at healthcare provider, tell the healthcare staff immediately you have returned from visiting [place], [place], [place], [place] or [place]; or traveled in the [place] to [place], [place], [place], or [place]; in the last two weeks or you have been in close contact with a person diagnosed with COVID-19 in the last 2 weeks.   . Tell the health care staff about your symptoms: fever, cough and shortness of breath. . After you have been seen by a medical provider, you will be either: o Tested for (COVID-19) and discharged home on quarantine except to seek medical care if symptoms worsen, and asked to  - Stay home and avoid contact with others until you get your results (4-5 days)  - Avoid travel on public transportation if possible (such as bus, train, or airplane) or o Sent to the Emergency Department by EMS for evaluation, COVID-19 testing, and possible  admission depending on your condition and test results.  What to do if you are LOW RISK for COVID-19?  Reduce your risk of any infection by using the same precautions used for avoiding the common cold or flu:  . Wash your hands often with soap and warm water for at least 20 seconds.  If soap and water are not readily available, use an alcohol-based hand sanitizer with at least 60% alcohol.  . If coughing or sneezing, cover your mouth and nose by coughing or sneezing into the elbow areas of your shirt or coat, into a tissue or into your sleeve (not your hands). . Avoid shaking hands with others and consider head nods or verbal greetings only. . Avoid touching your eyes, nose, or mouth with unwashed hands.  . Avoid close contact with people who are [place]ad Mau. . Avoid places or events with large numbers of people in one location, like concerts or sporting events. . Carefully consider travel plans you have or are making. . If you are planning any travel outside or inside the [place], visit the CDC's Travelers' Health webpage for the latest health notices. . If you have some symptoms but not all symptoms, continue to monitor at home and seek medical attention if your symptoms worsen. . If you are having a medical emergency, call 911.  04/06/19 SCREENING NEG SLS ADDITIONAL HEALTHCARE OPTIONS FOR PATIENTS  [facility] / e-Visit: [email or web address]         [facility]: [phone]    [facility]: [phone]                   [facility]: [phone]  

## 2019-04-07 ENCOUNTER — Other Ambulatory Visit: Payer: Self-pay

## 2019-04-07 ENCOUNTER — Ambulatory Visit (INDEPENDENT_AMBULATORY_CARE_PROVIDER_SITE_OTHER): Payer: BLUE CROSS/BLUE SHIELD | Admitting: Certified Nurse Midwife

## 2019-04-07 VITALS — BP 115/76 | HR 82 | Wt 226.1 lb

## 2019-04-07 DIAGNOSIS — O99283 Endocrine, nutritional and metabolic diseases complicating pregnancy, third trimester: Secondary | ICD-10-CM

## 2019-04-07 DIAGNOSIS — Z3493 Encounter for supervision of normal pregnancy, unspecified, third trimester: Secondary | ICD-10-CM

## 2019-04-07 DIAGNOSIS — E039 Hypothyroidism, unspecified: Secondary | ICD-10-CM

## 2019-04-07 LAB — POCT URINALYSIS DIPSTICK OB
Bilirubin, UA: NEGATIVE
Blood, UA: NEGATIVE
Glucose, UA: NEGATIVE
Ketones, UA: NEGATIVE
Leukocytes, UA: NEGATIVE
Nitrite, UA: NEGATIVE
POC,PROTEIN,UA: NEGATIVE
Spec Grav, UA: 1.015 (ref 1.010–1.025)
Urobilinogen, UA: 0.2 E.U./dL
pH, UA: 7 (ref 5.0–8.0)

## 2019-04-07 NOTE — Progress Notes (Signed)
ROB- pt is doing well, states she is " always thirsty" is drinking lots of water

## 2019-04-07 NOTE — Patient Instructions (Signed)
How a Baby Grows During Pregnancy    Pregnancy begins when a female's sperm enters a female's egg (fertilization). Fertilization usually happens in one of the tubes (fallopian tubes) that connect the ovaries to the womb (uterus). The fertilized egg moves down the fallopian tube to the uterus. Once it reaches the uterus, it implants into the lining of the uterus and begins to grow.  For the first 10 weeks, the fertilized egg is called an embryo. After 10 weeks, it is called a fetus. As the fetus continues to grow, it receives oxygen and nutrients through tissue (placenta) that grows to support the developing baby. The placenta is the life support system for the baby. It provides oxygen and nutrition and removes waste.  Learning as much as you can about your pregnancy and how your baby is developing can help you enjoy the experience. It can also make you aware of when there might be a problem and when to ask questions.  How long does a typical pregnancy last?  A pregnancy usually lasts 280 days, or about 40 weeks. Pregnancy is divided into three periods of growth, also called trimesters:   First trimester: 0-12 weeks.   Second trimester: 13-27 weeks.   Third trimester: 28-40 weeks.  The day when your baby is ready to be born (full term) is your estimated date of delivery.  How does my baby develop month by month?  First month   The fertilized egg attaches to the inside of the uterus.   Some cells will form the placenta. Others will form the fetus.   The arms, legs, brain, spinal cord, lungs, and heart begin to develop.   At the end of the first month, the heart begins to beat.  Second month   The bones, inner ear, eyelids, hands, and feet form.   The genitals develop.   By the end of 8 weeks, all major organs are developing.  Third month   All of the internal organs are forming.   Teeth develop below the gums.   Bones and muscles begin to grow. The spine can flex.   The skin is transparent.   Fingernails  and toenails begin to form.   Arms and legs continue to grow longer, and hands and feet develop.   The fetus is about 3 inches (7.6 cm) long.  Fourth month   The placenta is completely formed.   The external sex organs, neck, outer ear, eyebrows, eyelids, and fingernails are formed.   The fetus can hear, swallow, and move its arms and legs.   The kidneys begin to produce urine.   The skin is covered with a white, waxy coating (vernix) and very fine hair (lanugo).  Fifth month   The fetus moves around more and can be felt for the first time (quickening).   The fetus starts to sleep and wake up and may begin to suck its finger.   The nails grow to the end of the fingers.   The organ in the digestive system that makes bile (gallbladder) functions and helps to digest nutrients.   If your baby is a girl, eggs are present in her ovaries. If your baby is a boy, testicles start to move down into his scrotum.  Sixth month   The lungs are formed.   The eyes open. The brain continues to develop.   Your baby has fingerprints and toe prints. Your baby's hair grows thicker.   At the end of the second trimester, the   fetus is about 9 inches (22.9 cm) long.  Seventh month   The fetus kicks and stretches.   The eyes are developed enough to sense changes in light.   The hands can make a grasping motion.   The fetus responds to sound.  Eighth month   All organs and body systems are fully developed and functioning.   Bones harden, and taste buds develop. The fetus may hiccup.   Certain areas of the brain are still developing. The skull remains soft.  Ninth month   The fetus gains about  lb (0.23 kg) each week.   The lungs are fully developed.   Patterns of sleep develop.   The fetus's head typically moves into a head-down position (vertex) in the uterus to prepare for birth.   The fetus weighs 6-9 lb (2.72-4.08 kg) and is 19-20 inches (48.26-50.8 cm) long.  What can I do to have a healthy pregnancy and help  my baby develop?  General instructions   Take prenatal vitamins as directed by your health care provider. These include vitamins such as folic acid, iron, calcium, and vitamin D. They are important for healthy development.   Take medicines only as directed by your health care provider. Read labels and ask a pharmacist or your health care provider whether over-the-counter medicines, supplements, and prescription drugs are safe to take during pregnancy.   Keep all follow-up visits as directed by your health care provider. This is important. Follow-up visits include prenatal care and screening tests.  How do I know if my baby is developing well?  At each prenatal visit, your health care provider will do several different tests to check on your health and keep track of your baby's development. These include:   Fundal height and position.  ? Your health care provider will measure your growing belly from your pubic bone to the top of the uterus using a tape measure.  ? Your health care provider will also feel your belly to determine your baby's position.   Heartbeat.  ? An ultrasound in the first trimester can confirm pregnancy and show a heartbeat, depending on how far along you are.  ? Your health care provider will check your baby's heart rate at every prenatal visit.   Second trimester ultrasound.  ? This ultrasound checks your baby's development. It also may show your baby's gender.  What should I do if I have concerns about my baby's development?  Always talk with your health care provider about any concerns that you may have about your pregnancy and your baby.  Summary   A pregnancy usually lasts 280 days, or about 40 weeks. Pregnancy is divided into three periods of growth, also called trimesters.   Your health care provider will monitor your baby's growth and development throughout your pregnancy.   Follow your health care provider's recommendations about taking prenatal vitamins and medicines during  your pregnancy.   Talk with your health care provider if you have any concerns about your pregnancy or your developing baby.  This information is not intended to replace advice given to you by your health care provider. Make sure you discuss any questions you have with your health care provider.  Document Released: 04/27/2008 Document Revised: 09/22/2017 Document Reviewed: 09/22/2017  Elsevier Interactive Patient Education  2019 [employer]

## 2019-04-07 NOTE — Progress Notes (Signed)
ROB doing well. Feels good movement. TSH today ( hx hypothyroidism). Discussed birth plan. She is filling out form and will bring it in. She plans on Lactational amenorrhea for [facility]. Reviewed more effective with exclusive BF. She verbalizes understanding. Follow up 2 wks.   Doreene Burke, CNM

## 2019-04-08 LAB — TSH: TSH: 1.14 u[IU]/mL (ref 0.450–4.500)

## 2019-04-25 ENCOUNTER — Telehealth: Payer: Self-pay

## 2019-04-25 NOTE — Telephone Encounter (Signed)
Coronavirus (COVID-19) Are you at risk?  Are you at risk for the Coronavirus (COVID-19)?  To be considered HIGH RISK for Coronavirus (COVID-19), you have to meet the following criteria:  . Traveled to [place], [place], [place], [place] or [place]; or in the [place] to [place], [place], [place], or [place]; and have fever, cough, and shortness of breath within the last 2 weeks of travel OR . Been in close contact with a person diagnosed with COVID-19 within the last 2 weeks and have fever, cough, and shortness of breath . IF YOU DO NOT MEET THESE CRITERIA, YOU ARE CONSIDERED LOW RISK FOR COVID-19.  What to do if you are HIGH RISK for COVID-19?  . If you are having a medical emergency, call 911. . Seek medical care right away. Before you go to a doctor's office, urgent care or emergency department, call ahead and tell them about your recent travel, contact with someone diagnosed with COVID-19, and your symptoms. You should receive instructions from your physician's office regarding next steps of care.  . When you arrive at healthcare provider, tell the healthcare staff immediately you have returned from visiting [place], [place], [place], [place] or [place]; or traveled in the [place] to [place], [place], [place], or [place]; in the last two weeks or you have been in close contact with a person diagnosed with COVID-19 in the last 2 weeks.   . Tell the health care staff about your symptoms: fever, cough and shortness of breath. . After you have been seen by a medical provider, you will be either: o Tested for (COVID-19) and discharged home on quarantine except to seek medical care if symptoms worsen, and asked to  - Stay home and avoid contact with others until you get your results (4-5 days)  - Avoid travel on public transportation if possible (such as bus, train, or airplane) or o Sent to the Emergency Department by EMS for evaluation, COVID-19 testing, and possible  admission depending on your condition and test results.  What to do if you are LOW RISK for COVID-19?  Reduce your risk of any infection by using the same precautions used for avoiding the common cold or flu:  . Wash your hands often with soap and warm water for at least 20 seconds.  If soap and water are not readily available, use an alcohol-based hand sanitizer with at least 60% alcohol.  . If coughing or sneezing, cover your mouth and nose by coughing or sneezing into the elbow areas of your shirt or coat, into a tissue or into your sleeve (not your hands). . Avoid shaking hands with others and consider head nods or verbal greetings only. . Avoid touching your eyes, nose, or mouth with unwashed hands.  . Avoid close contact with people who are sick. . Avoid places or events with large numbers of people in one location, like concerts or sporting events. . Carefully consider travel plans you have or are making. . If you are planning any travel outside or inside the [place], visit the CDC's Travelers' Health webpage for the latest health notices. . If you have some symptoms but not all symptoms, continue to monitor at home and seek medical attention if your symptoms worsen. . If you are having a medical emergency, call 911.   ADDITIONAL HEALTHCARE OPTIONS FOR PATIENTS  [facility] / e-Visit: [email or web address]         [facility]: [phone]  [facility]: [phone]                   [facility]: [phone]   Pre-screen negative, DM.   

## 2019-04-26 ENCOUNTER — Ambulatory Visit (INDEPENDENT_AMBULATORY_CARE_PROVIDER_SITE_OTHER): Payer: BLUE CROSS/BLUE SHIELD | Admitting: Obstetrics and Gynecology

## 2019-04-26 ENCOUNTER — Other Ambulatory Visit: Payer: Self-pay

## 2019-04-26 VITALS — BP 125/80 | HR 80 | Wt 231.4 lb

## 2019-04-26 DIAGNOSIS — K649 Unspecified hemorrhoids: Secondary | ICD-10-CM

## 2019-04-26 DIAGNOSIS — Z3493 Encounter for supervision of normal pregnancy, unspecified, third trimester: Secondary | ICD-10-CM

## 2019-04-26 LAB — POCT URINALYSIS DIPSTICK OB
Bilirubin, UA: NEGATIVE
Blood, UA: NEGATIVE
Glucose, UA: NEGATIVE
Ketones, UA: NEGATIVE
Leukocytes, UA: NEGATIVE
Nitrite, UA: NEGATIVE
POC,PROTEIN,UA: NEGATIVE
Spec Grav, UA: <=1.005 — AB (ref 1.010–1.025)
Urobilinogen, UA: 0.2 E.U./dL
pH, UA: 6.5 (ref 5.0–8.0)

## 2019-04-26 MED ORDER — HYDROCORT-PRAMOXINE (PERIANAL) 1-1 % EX FOAM: 1.0000 | Freq: Two times a day (BID) | CUTANEOUS | 1 refills | Status: DC

## 2019-04-26 MED ORDER — BREAST PUMP MISC: 0 refills | Status: DC

## 2019-04-26 NOTE — Patient Instructions (Signed)
Hemorrhoids Hemorrhoids are swollen veins that may develop:  In the butt (rectum). These are called internal hemorrhoids.  Around the opening of the butt (anus). These are called external hemorrhoids. Hemorrhoids can cause pain, itching, or bleeding. Most of the time, they do not cause serious problems. They usually get better with diet changes, lifestyle changes, and other home treatments. What are the causes? This condition may be caused by:  Having trouble pooping (constipation).  Pushing hard (straining) to poop.  Watery poop (diarrhea).  Pregnancy.  Being very overweight (obese).  Sitting for long periods of time.  Heavy lifting or other activity that causes you to strain.  Anal sex.  Riding a bike for a long period of time. What are the signs or symptoms? Symptoms of this condition include:  Pain.  Itching or soreness in the butt.  Bleeding from the butt.  Leaking poop.  Swelling in the area.  One or more lumps around the opening of your butt. How is this diagnosed? A doctor can often diagnose this condition by looking at the affected area. The doctor may also:  Do an exam that involves feeling the area with a gloved hand (digital rectal exam).  Examine the area inside your butt using a small tube (anoscope).  Order blood tests. This may be done if you have lost a lot of blood.  Have you get a test that involves looking inside the colon using a flexible tube with a camera on the end (sigmoidoscopy or colonoscopy). How is this treated? This condition can usually be treated at home. Your doctor may tell you to change what you eat, make lifestyle changes, or try home treatments. If these do not help, procedures can be done to remove the hemorrhoids or make them smaller. These may involve:  Placing rubber bands at the base of the hemorrhoids to cut off their blood supply.  Injecting medicine into the hemorrhoids to shrink them.  Shining a type of light  energy onto the hemorrhoids to cause them to fall off.  Doing surgery to remove the hemorrhoids or cut off their blood supply. Follow these instructions at home: Eating and drinking   Eat foods that have a lot of fiber in them. These include whole grains, beans, nuts, fruits, and vegetables.  Ask your doctor about taking products that have added fiber (fibersupplements).  Reduce the amount of fat in your diet. You can do this by: ? Eating low-fat dairy products. ? Eating less red meat. ? Avoiding processed foods.  Drink enough fluid to keep your pee (urine) pale yellow. Managing pain and swelling   Take a warm-water bath (sitz bath) for 20 minutes to ease pain. Do this 3-4 times a day. You may do this in a bathtub or using a portable sitz bath that fits over the toilet.  If told, put ice on the painful area. It may be helpful to use ice between your warm baths. ? Put ice in a plastic bag. ? Place a towel between your skin and the bag. ? Leave the ice on for 20 minutes, 2-3 times a day. General instructions  Take over-the-counter and prescription medicines only as told by your doctor. ? Medicated creams and medicines may be used as told.  Exercise often. Ask your doctor how much and what kind of exercise is best for you.  Go to the bathroom when you have the urge to poop. Do not wait.  Avoid pushing too hard when you poop.  Keep your   butt dry and clean. Use wet toilet paper or moist towelettes after pooping.  Do not sit on the toilet for a long time.  Keep all follow-up visits as told by your doctor. This is important. Contact a doctor if you:  Have pain and swelling that do not get better with treatment or medicine.  Have trouble pooping.  Cannot poop.  Have pain or swelling outside the area of the hemorrhoids. Get help right away if you have:  Bleeding that will not stop. Summary  Hemorrhoids are swollen veins in the butt or around the opening of the butt.   They can cause pain, itching, or bleeding.  Eat foods that have a lot of fiber in them. These include whole grains, beans, nuts, fruits, and vegetables.  Take a warm-water bath (sitz bath) for 20 minutes to ease pain. Do this 3-4 times a day. This information is not intended to replace advice given to you by your health care provider. Make sure you discuss any questions you have with your health care provider. Document Released: 08/18/2008 Document Revised: 03/31/2018 Document Reviewed: 03/31/2018 Elsevier Interactive Patient Education  2019 Elsevier Inc.  

## 2019-04-26 NOTE — Progress Notes (Signed)
ROB- reports not sleeping well and worsening hemorrhoids.using prep-H and Tucks, epsom salt soaks, colace- one large one- will try proctofoam

## 2019-05-10 ENCOUNTER — Telehealth: Payer: Self-pay

## 2019-05-10 NOTE — Telephone Encounter (Signed)
Coronavirus (COVID-19) Are you at risk?  Are you at risk for the Coronavirus (COVID-19)?  To be considered HIGH RISK for Coronavirus (COVID-19), you have to meet the following criteria:  . Traveled to [place], [place], [place], [place] or [place]; or in the [place] to [place], [place], [place], or [place]; and have fever, cough, and shortness of breath within the last 2 weeks of travel OR . Been in close contact with a person diagnosed with COVID-19 within the last 2 weeks and have fever, cough, and shortness of breath . IF YOU DO NOT MEET THESE CRITERIA, YOU ARE CONSIDERED LOW RISK FOR COVID-19.  What to do if you are HIGH RISK for COVID-19?  . If you are having a medical emergency, call 911. . Seek medical care right away. Before you go to a doctor's office, urgent care or emergency department, call ahead and tell them about your recent travel, contact with someone diagnosed with COVID-19, and your symptoms. You should receive instructions from your physician's office regarding next steps of care.  . When you arrive at healthcare provider, tell the healthcare staff immediately you have returned from visiting [place], [place], [place], [place] or [place]; or traveled in the [place] to [place], [place], [place], or [place]; in the last two weeks or you have been in close contact with a person diagnosed with COVID-19 in the last 2 weeks.   . Tell the health care staff about your symptoms: fever, cough and shortness of breath. . After you have been seen by a medical provider, you will be either: o Tested for (COVID-19) and discharged home on quarantine except to seek medical care if symptoms worsen, and asked to  - Stay home and avoid contact with others until you get your results (4-5 days)  - Avoid travel on public transportation if possible (such as bus, train, or airplane) or o Sent to the Emergency Department by EMS for evaluation, COVID-19 testing, and possible  admission depending on your condition and test results.  What to do if you are LOW RISK for COVID-19?  Reduce your risk of any infection by using the same precautions used for avoiding the common cold or flu:  . Wash your hands often with soap and warm water for at least 20 seconds.  If soap and water are not readily available, use an alcohol-based hand sanitizer with at least 60% alcohol.  . If coughing or sneezing, cover your mouth and nose by coughing or sneezing into the elbow areas of your shirt or coat, into a tissue or into your sleeve (not your hands). . Avoid shaking hands with others and consider head nods or verbal greetings only. . Avoid touching your eyes, nose, or mouth with unwashed hands.  . Avoid close contact with people who are Mary-Anne Polizzi. . Avoid places or events with large numbers of people in one location, like concerts or sporting events. . Carefully consider travel plans you have or are making. . If you are planning any travel outside or inside the [place], visit the CDC's Travelers' Health webpage for the latest health notices. . If you have some symptoms but not all symptoms, continue to monitor at home and seek medical attention if your symptoms worsen. . If you are having a medical emergency, call 911.  05/10/19 SCREENING NEG SLS ADDITIONAL HEALTHCARE OPTIONS FOR PATIENTS  [facility] / e-Visit: [email or web address]         [facility]: [phone]    [facility]: [phone]                   [facility]: [phone]  

## 2019-05-11 ENCOUNTER — Other Ambulatory Visit: Payer: Self-pay

## 2019-05-11 ENCOUNTER — Ambulatory Visit (INDEPENDENT_AMBULATORY_CARE_PROVIDER_SITE_OTHER): Payer: BC Managed Care – PPO | Admitting: Certified Nurse Midwife

## 2019-05-11 VITALS — BP 128/87 | HR 87 | Wt 228.9 lb

## 2019-05-11 DIAGNOSIS — Z3493 Encounter for supervision of normal pregnancy, unspecified, third trimester: Secondary | ICD-10-CM

## 2019-05-11 DIAGNOSIS — Z0289 Encounter for other administrative examinations: Secondary | ICD-10-CM

## 2019-05-11 LAB — POCT URINALYSIS DIPSTICK OB
Bilirubin, UA: NEGATIVE
Blood, UA: NEGATIVE
Glucose, UA: NEGATIVE
Ketones, UA: NEGATIVE
Leukocytes, UA: NEGATIVE
Nitrite, UA: NEGATIVE
POC,PROTEIN,UA: NEGATIVE
Spec Grav, UA: 1.01 (ref 1.010–1.025)
Urobilinogen, UA: 0.2 E.U./dL
pH, UA: 6 (ref 5.0–8.0)

## 2019-05-11 NOTE — Patient Instructions (Signed)

## 2019-05-11 NOTE — Progress Notes (Signed)
ROB-No complaints.  

## 2019-05-11 NOTE — Progress Notes (Signed)
ROB-Doing well, no questions or concerns. Completed Illustrated Birth Choices handout reviewed, will scan into chart. Selected [facility] as child's provider. Finished online childbirth classes. Anticipatory guidance regarding course of prenatal care. Reviewed red flag symptoms and when to call. RTC x 1 wk for 36 wk cultures and ROB or sooner if needed.

## 2019-05-16 ENCOUNTER — Telehealth: Payer: Self-pay

## 2019-05-16 NOTE — Telephone Encounter (Signed)
Coronavirus (COVID-19) Are you at risk?  Are you at risk for the Coronavirus (COVID-19)?  To be considered HIGH RISK for Coronavirus (COVID-19), you have to meet the following criteria:  . Traveled to [place], [place], [place], [place] or [place]; or in the [place] to [place], [place], [place], or [place]; and have fever, cough, and shortness of breath within the last 2 weeks of travel OR . Been in close contact with a person diagnosed with COVID-19 within the last 2 weeks and have fever, cough, and shortness of breath . IF YOU DO NOT MEET THESE CRITERIA, YOU ARE CONSIDERED LOW RISK FOR COVID-19.  What to do if you are HIGH RISK for COVID-19?  . If you are having a medical emergency, call 911. . Seek medical care right away. Before you go to a doctor's office, urgent care or emergency department, call ahead and tell them about your recent travel, contact with someone diagnosed with COVID-19, and your symptoms. You should receive instructions from your physician's office regarding next steps of care.  . When you arrive at healthcare provider, tell the healthcare staff immediately you have returned from visiting [place], [place], [place], [place] or [place]; or traveled in the [place] to [place], [place], [place], or [place]; in the last two weeks or you have been in close contact with a person diagnosed with COVID-19 in the last 2 weeks.   . Tell the health care staff about your symptoms: fever, cough and shortness of breath. . After you have been seen by a medical provider, you will be either: o Tested for (COVID-19) and discharged home on quarantine except to seek medical care if symptoms worsen, and asked to  - Stay home and avoid contact with others until you get your results (4-5 days)  - Avoid travel on public transportation if possible (such as bus, train, or airplane) or o Sent to the Emergency Department by EMS for evaluation, COVID-19 testing, and possible  admission depending on your condition and test results.  What to do if you are LOW RISK for COVID-19?  Reduce your risk of any infection by using the same precautions used for avoiding the common cold or flu:  . Wash your hands often with soap and warm water for at least 20 seconds.  If soap and water are not readily available, use an alcohol-based hand sanitizer with at least 60% alcohol.  . If coughing or sneezing, cover your mouth and nose by coughing or sneezing into the elbow areas of your shirt or coat, into a tissue or into your sleeve (not your hands). . Avoid shaking hands with others and consider head nods or verbal greetings only. . Avoid touching your eyes, nose, or mouth with unwashed hands.  . Avoid close contact with people who are Julie Rowe. . Avoid places or events with large numbers of people in one location, like concerts or sporting events. . Carefully consider travel plans you have or are making. . If you are planning any travel outside or inside the [place], visit the CDC's Travelers' Health webpage for the latest health notices. . If you have some symptoms but not all symptoms, continue to monitor at home and seek medical attention if your symptoms worsen. . If you are having a medical emergency, call 911.  05/16/19 SCREENING NEG SLS ADDITIONAL HEALTHCARE OPTIONS FOR PATIENTS  [facility] / e-Visit: [email or web address]         [facility]: [phone]    [facility]: [phone]                   [facility]: [phone]  

## 2019-05-17 ENCOUNTER — Other Ambulatory Visit: Payer: Self-pay

## 2019-05-17 ENCOUNTER — Encounter: Payer: Self-pay | Admitting: Certified Nurse Midwife

## 2019-05-17 ENCOUNTER — Ambulatory Visit (INDEPENDENT_AMBULATORY_CARE_PROVIDER_SITE_OTHER): Payer: BC Managed Care – PPO | Admitting: Certified Nurse Midwife

## 2019-05-17 VITALS — BP 117/76 | HR 92 | Wt 231.1 lb

## 2019-05-17 DIAGNOSIS — Z3A36 36 weeks gestation of pregnancy: Secondary | ICD-10-CM

## 2019-05-17 DIAGNOSIS — Z3403 Encounter for supervision of normal first pregnancy, third trimester: Secondary | ICD-10-CM

## 2019-05-17 DIAGNOSIS — Z3493 Encounter for supervision of normal pregnancy, unspecified, third trimester: Secondary | ICD-10-CM

## 2019-05-17 LAB — POCT URINALYSIS DIPSTICK OB
Bilirubin, UA: NEGATIVE
Blood, UA: NEGATIVE
Glucose, UA: NEGATIVE
Ketones, UA: NEGATIVE
Leukocytes, UA: NEGATIVE
Nitrite, UA: NEGATIVE
POC,PROTEIN,UA: NEGATIVE
Spec Grav, UA: 1.015 (ref 1.010–1.025)
Urobilinogen, UA: 0.2 E.U./dL
pH, UA: 5 (ref 5.0–8.0)

## 2019-05-17 NOTE — Patient Instructions (Signed)
Group B Streptococcus Infection During Pregnancy  Group B Streptococcus (GBS) is a type of bacteria (Streptococcus agalactiae) that is often found in healthy people, commonly in the rectum, vagina, and intestines. In people who are healthy and not pregnant, the bacteria rarely cause serious illness or complications. However, women who test positive for GBS during pregnancy can pass the bacteria to their baby during childbirth, which can cause serious infection in the baby after birth. Women with GBS may also have infections during their pregnancy or immediately after childbirth, such as such as urinary tract infections (UTIs) or infections of the uterus (uterine infections). Having GBS also increases a woman's risk of complications during pregnancy, such as early (preterm) labor or delivery, miscarriage, or stillbirth. Routine testing (screening) for GBS is recommended for all pregnant women. What increases the risk? You may have a higher risk for GBS infection during pregnancy if you had one during a past pregnancy. What are the signs or symptoms? In most cases, GBS infection does not cause symptoms in pregnant women. Signs and symptoms of a possible GBS-related infection may include:  Labor starting before the 37th week of pregnancy.  A UTI or bladder infection, which may cause: ? Fever. ? Pain or burning during urination. ? Frequent urination.  Fever during labor, along with: ? Bad-smelling discharge. ? Uterine tenderness. ? Rapid heartbeat in the mother, baby, or both. Rare but serious symptoms of a possible GBS-related infection in women include:  Blood infection (septicemia). This may cause fever, chills, or confusion.  Lung infection (pneumonia). This may cause fever, chills, cough, rapid breathing, difficulty breathing, or chest pain.  Bone, joint, skin, or soft tissue infection. How is this diagnosed? You may be screened for GBS between week 35 and week 37 of your pregnancy. If  you have symptoms of preterm labor, you may be screened earlier. This condition is diagnosed based on lab test results from:  A swab of fluid from the vagina and rectum.  A urine sample. How is this treated? This condition is treated with antibiotic medicine. When you go into labor, or as soon as your water breaks (your membranes rupture), you will be given antibiotics through an IV tube. Antibiotics will continue until after you give birth. If you are having a cesarean delivery, you do not need antibiotics unless your membranes have already ruptured. Follow these instructions at home:  Take over-the-counter and prescription medicines only as told by your health care provider.  Take your antibiotic medicine as told by your health care provider. Do not stop taking the antibiotic even if you start to feel better.  Keep all pre-birth (prenatal) visits and follow-up visits as told by your health care provider. This is important. Contact a health care provider if:  You have pain or burning when you urinate.  You have to urinate frequently.  You have a fever or chills.  You develop a bad-smelling vaginal discharge. Get help right away if:  Your membranes rupture.  You go into labor.  You have severe pain in your abdomen.  You have difficulty breathing.  You have chest pain. This information is not intended to replace advice given to you by your health care provider. Make sure you discuss any questions you have with your health care provider. Document Released: 02/16/2008 Document Revised: 06/05/2016 Document Reviewed: 06/04/2016 Elsevier Interactive Patient Education  2019 Elsevier Inc.  

## 2019-05-17 NOTE — Progress Notes (Signed)
ROB doing well. GBS and cultures today. Feels good movement. SVE FT/thick/-2. Labor precautions reviewed. Follow up 1 wk with Melody.   Philip Aspen, CNM

## 2019-05-18 ENCOUNTER — Telehealth: Payer: Self-pay | Admitting: Certified Nurse Midwife

## 2019-05-18 NOTE — Telephone Encounter (Signed)
Left detailed message paperwork is ready and up front for pick up

## 2019-05-18 NOTE — Telephone Encounter (Signed)
The patient called and stated that she wants to check the status of her FMLA paperwork, The patient stated that she was informed that the paperwork would be complete today. Please advise.

## 2019-05-19 LAB — STREP GP B NAA: Strep Gp B NAA: NEGATIVE

## 2019-05-24 ENCOUNTER — Telehealth: Payer: Self-pay

## 2019-05-24 NOTE — Telephone Encounter (Signed)
Coronavirus (COVID-19) Are you at risk?  Are you at risk for the Coronavirus (COVID-19)?  To be considered HIGH RISK for Coronavirus (COVID-19), you have to meet the following criteria:  . Traveled to [place], [place], [place], [place] or [place]; or in the [place] to [place], [place], [place], or [place]; and have fever, cough, and shortness of breath within the last 2 weeks of travel OR . Been in close contact with a person diagnosed with COVID-19 within the last 2 weeks and have fever, cough, and shortness of breath . IF YOU DO NOT MEET THESE CRITERIA, YOU ARE CONSIDERED LOW RISK FOR COVID-19.  What to do if you are HIGH RISK for COVID-19?  . If you are having a medical emergency, call 911. . Seek medical care right away. Before you go to a doctor's office, urgent care or emergency department, call ahead and tell them about your recent travel, contact with someone diagnosed with COVID-19, and your symptoms. You should receive instructions from your physician's office regarding next steps of care.  . When you arrive at healthcare provider, tell the healthcare staff immediately you have returned from visiting [place], [place], [place], [place] or [place]; or traveled in the [place] to [place], [place], [place], or [place]; in the last two weeks or you have been in close contact with a person diagnosed with COVID-19 in the last 2 weeks.   . Tell the health care staff about your symptoms: fever, cough and shortness of breath. . After you have been seen by a medical provider, you will be either: o Tested for (COVID-19) and discharged home on quarantine except to seek medical care if symptoms worsen, and asked to  - Stay home and avoid contact with others until you get your results (4-5 days)  - Avoid travel on public transportation if possible (such as bus, train, or airplane) or o Sent to the Emergency Department by EMS for evaluation, COVID-19 testing, and possible  admission depending on your condition and test results.  What to do if you are LOW RISK for COVID-19?  Reduce your risk of any infection by using the same precautions used for avoiding the common cold or flu:  . Wash your hands often with soap and warm water for at least 20 seconds.  If soap and water are not readily available, use an alcohol-based hand sanitizer with at least 60% alcohol.  . If coughing or sneezing, cover your mouth and nose by coughing or sneezing into the elbow areas of your shirt or coat, into a tissue or into your sleeve (not your hands). . Avoid shaking hands with others and consider head nods or verbal greetings only. . Avoid touching your eyes, nose, or mouth with unwashed hands.  . Avoid close contact with people who are Julie Rowe. . Avoid places or events with large numbers of people in one location, like concerts or sporting events. . Carefully consider travel plans you have or are making. . If you are planning any travel outside or inside the [place], visit the CDC's Travelers' Health webpage for the latest health notices. . If you have some symptoms but not all symptoms, continue to monitor at home and seek medical attention if your symptoms worsen. . If you are having a medical emergency, call 911.  05/24/19 SCREENING NEG SLS ADDITIONAL HEALTHCARE OPTIONS FOR PATIENTS  [facility] / e-Visit: [email or web address]         [facility]: [phone]    [facility]: [phone]                   [facility]: [phone]  

## 2019-05-25 ENCOUNTER — Ambulatory Visit (INDEPENDENT_AMBULATORY_CARE_PROVIDER_SITE_OTHER): Payer: 59 | Admitting: Certified Nurse Midwife

## 2019-05-25 ENCOUNTER — Other Ambulatory Visit: Payer: Self-pay

## 2019-05-25 ENCOUNTER — Other Ambulatory Visit (HOSPITAL_COMMUNITY)
Admission: RE | Admit: 2019-05-25 | Discharge: 2019-05-25 | Disposition: A | Discharge status: Home / Self Care | Payer: 59 | Source: Ambulatory Visit | Attending: Certified Nurse Midwife | Admitting: Certified Nurse Midwife

## 2019-05-25 VITALS — BP 129/73 | HR 82 | Wt 232.6 lb

## 2019-05-25 DIAGNOSIS — Z113 Encounter for screening for infections with a predominantly sexual mode of transmission: Secondary | ICD-10-CM | POA: Diagnosis not present

## 2019-05-25 DIAGNOSIS — Z3493 Encounter for supervision of normal pregnancy, unspecified, third trimester: Secondary | ICD-10-CM | POA: Diagnosis not present

## 2019-05-25 LAB — POCT URINALYSIS DIPSTICK OB
Bilirubin, UA: NEGATIVE
Blood, UA: NEGATIVE
Ketones, UA: NEGATIVE
Leukocytes, UA: NEGATIVE
Nitrite, UA: NEGATIVE
POC,PROTEIN,UA: NEGATIVE
Spec Grav, UA: 1.015 (ref 1.010–1.025)
Urobilinogen, UA: 0.2 E.U./dL
pH, UA: 6.5 (ref 5.0–8.0)

## 2019-05-25 NOTE — Progress Notes (Signed)
ROB-Doing well, reports occasional cramping. [employer] [employer] and Herbal Prep handouts given. Discussed GBS negative status. GC/Ch swab collected, see orders. SVE unchanged from previous exam. Anticipatory guidance regarding course of prenatal care. Reviewed red flag symptoms and when to call. RTC x 1 week for ROB or sooner if needed.

## 2019-05-25 NOTE — Progress Notes (Signed)
ROB-No complaints.  

## 2019-05-25 NOTE — Addendum Note (Signed)
Addended by: Edrick Oh J on: 05/25/2019 11:44 AM   Modules accepted: Orders

## 2019-05-25 NOTE — Patient Instructions (Signed)

## 2019-05-30 ENCOUNTER — Other Ambulatory Visit: Payer: Self-pay | Admitting: Certified Nurse Midwife

## 2019-05-30 ENCOUNTER — Telehealth: Payer: Self-pay

## 2019-05-30 LAB — GC/CHLAMYDIA PROBE AMP
Chlamydia trachomatis, NAA: NEGATIVE
Neisseria Gonorrhoeae by PCR: NEGATIVE

## 2019-05-30 LAB — GC/CHLAMYDIA PROBE AMP (~~LOC~~) NOT AT ARMC
Chlamydia: NEGATIVE
Neisseria Gonorrhea: NEGATIVE

## 2019-05-30 NOTE — Telephone Encounter (Signed)
Coronavirus (COVID-19) Are you at risk?  Are you at risk for the Coronavirus (COVID-19)?  To be considered HIGH RISK for Coronavirus (COVID-19), you have to meet the following criteria:  . Traveled to [place], [place], [place], [place] or [place]; or in the [place] to [place], [place], [place], or [place]; and have fever, cough, and shortness of breath within the last 2 weeks of travel OR . Been in close contact with a person diagnosed with COVID-19 within the last 2 weeks and have fever, cough, and shortness of breath . IF YOU DO NOT MEET THESE CRITERIA, YOU ARE CONSIDERED LOW RISK FOR COVID-19.  What to do if you are HIGH RISK for COVID-19?  . If you are having a medical emergency, call 911. . Seek medical care right away. Before you go to a doctor's office, urgent care or emergency department, call ahead and tell them about your recent travel, contact with someone diagnosed with COVID-19, and your symptoms. You should receive instructions from your physician's office regarding next steps of care.  . When you arrive at healthcare provider, tell the healthcare staff immediately you have returned from visiting [place], [place], [place], [place] or [place]; or traveled in the [place] to [place], [place], [place], or [place]; in the last two weeks or you have been in close contact with a person diagnosed with COVID-19 in the last 2 weeks.   . Tell the health care staff about your symptoms: fever, cough and shortness of breath. . After you have been seen by a medical provider, you will be either: o Tested for (COVID-19) and discharged home on quarantine except to seek medical care if symptoms worsen, and asked to  - Stay home and avoid contact with others until you get your results (4-5 days)  - Avoid travel on public transportation if possible (such as bus, train, or airplane) or o Sent to the Emergency Department by EMS for evaluation, COVID-19 testing, and possible  admission depending on your condition and test results.  What to do if you are LOW RISK for COVID-19?  Reduce your risk of any infection by using the same precautions used for avoiding the common cold or flu:  . Wash your hands often with soap and warm water for at least 20 seconds.  If soap and water are not readily available, use an alcohol-based hand sanitizer with at least 60% alcohol.  . If coughing or sneezing, cover your mouth and nose by coughing or sneezing into the elbow areas of your shirt or coat, into a tissue or into your sleeve (not your hands). . Avoid shaking hands with others and consider head nods or verbal greetings only. . Avoid touching your eyes, nose, or mouth with unwashed hands.  . Avoid close contact with people who are Meilyn Heindl. . Avoid places or events with large numbers of people in one location, like concerts or sporting events. . Carefully consider travel plans you have or are making. . If you are planning any travel outside or inside the [place], visit the CDC's Travelers' Health webpage for the latest health notices. . If you have some symptoms but not all symptoms, continue to monitor at home and seek medical attention if your symptoms worsen. . If you are having a medical emergency, call 911.  05/30/19 SCREENING NEG SLS ADDITIONAL HEALTHCARE OPTIONS FOR PATIENTS  [facility] / e-Visit: [email or web address]         [facility]: [phone]    [facility]: [phone]                   [facility]: [phone]  

## 2019-05-31 ENCOUNTER — Encounter: Payer: Self-pay | Admitting: Certified Nurse Midwife

## 2019-05-31 ENCOUNTER — Other Ambulatory Visit: Payer: Self-pay

## 2019-05-31 ENCOUNTER — Ambulatory Visit (INDEPENDENT_AMBULATORY_CARE_PROVIDER_SITE_OTHER): Payer: BC Managed Care – PPO | Admitting: Certified Nurse Midwife

## 2019-05-31 VITALS — BP 121/81 | HR 105 | Wt 236.3 lb

## 2019-05-31 DIAGNOSIS — Z3403 Encounter for supervision of normal first pregnancy, third trimester: Secondary | ICD-10-CM

## 2019-05-31 LAB — POCT URINALYSIS DIPSTICK OB
Bilirubin, UA: NEGATIVE
Blood, UA: NEGATIVE
Glucose, UA: NEGATIVE
Ketones, UA: NEGATIVE
Leukocytes, UA: NEGATIVE
Nitrite, UA: NEGATIVE
POC,PROTEIN,UA: NEGATIVE
Spec Grav, UA: 1.02 (ref 1.010–1.025)
Urobilinogen, UA: 0.2 E.U./dL
pH, UA: 7 (ref 5.0–8.0)

## 2019-05-31 NOTE — Patient Instructions (Signed)
Braxton Hicks Contractions Contractions of the uterus can occur throughout pregnancy, but they are not always a sign that you are in labor. You may have practice contractions called Braxton Hicks contractions. These false labor contractions are sometimes confused with true labor. What are Braxton Hicks contractions? Braxton Hicks contractions are tightening movements that occur in the muscles of the uterus before labor. Unlike true labor contractions, these contractions do not result in opening (dilation) and thinning of the cervix. Toward the end of pregnancy (32-34 weeks), Braxton Hicks contractions can happen more often and may become stronger. These contractions are sometimes difficult to tell apart from true labor because they can be very uncomfortable. You should not feel embarrassed if you go to the hospital with false labor. Sometimes, the only way to tell if you are in true labor is for your health care provider to look for changes in the cervix. The health care provider will do a physical exam and may monitor your contractions. If you are not in true labor, the exam should show that your cervix is not dilating and your water has not broken. If there are no other health problems associated with your pregnancy, it is completely safe for you to be sent home with false labor. You may continue to have Braxton Hicks contractions until you go into true labor. How to tell the difference between true labor and false labor True labor  Contractions last 30-70 seconds.  Contractions become very regular.  Discomfort is usually felt in the top of the uterus, and it spreads to the lower abdomen and low back.  Contractions do not go away with walking.  Contractions usually become more intense and increase in frequency.  The cervix dilates and gets thinner. False labor  Contractions are usually shorter and not as strong as true labor contractions.  Contractions are usually irregular.  Contractions  are often felt in the front of the lower abdomen and in the groin.  Contractions may go away when you walk around or change positions while lying down.  Contractions get weaker and are shorter-lasting as time goes on.  The cervix usually does not dilate or become thin. Follow these instructions at home:   Take over-the-counter and prescription medicines only as told by your health care provider.  Keep up with your usual exercises and follow other instructions from your health care provider.  Eat and drink lightly if you think you are going into labor.  If Braxton Hicks contractions are making you uncomfortable: ? Change your position from lying down or resting to walking, or change from walking to resting. ? Sit and rest in a tub of warm water. ? Drink enough fluid to keep your urine pale yellow. Dehydration may cause these contractions. ? Do slow and deep breathing several times an hour.  Keep all follow-up prenatal visits as told by your health care provider. This is important. Contact a health care provider if:  You have a fever.  You have continuous pain in your abdomen. Get help right away if:  Your contractions become stronger, more regular, and closer together.  You have fluid leaking or gushing from your vagina.  You pass blood-tinged mucus (bloody show).  You have bleeding from your vagina.  You have low back pain that you never had before.  You feel your baby's head pushing down and causing pelvic pressure.  Your baby is not moving inside you as much as it used to. Summary  Contractions that occur before labor are   called Braxton Hicks contractions, false labor, or practice contractions.  Braxton Hicks contractions are usually shorter, weaker, farther apart, and less regular than true labor contractions. True labor contractions usually become progressively stronger and regular, and they become more frequent.  Manage discomfort from Braxton Hicks contractions  by changing position, resting in a warm bath, drinking plenty of water, or practicing deep breathing. This information is not intended to replace advice given to you by your health care provider. Make sure you discuss any questions you have with your health care provider. Document Released: 03/25/2017 Document Revised: 10/22/2017 Document Reviewed: 03/25/2017 Elsevier Patient Education  2020 [employer] Inc[employer]  

## 2019-05-31 NOTE — Progress Notes (Signed)
ROB doing well. Feels good movement. Labor precautions reviewed. Follow up 1 wk  Julie Rowe, CNM  

## 2019-05-31 NOTE — Addendum Note (Signed)
Addended by: Raliegh Ip on: 05/31/2019 03:48 PM   Modules accepted: Orders

## 2019-06-06 ENCOUNTER — Telehealth: Payer: Self-pay | Admitting: Certified Nurse Midwife

## 2019-06-06 ENCOUNTER — Other Ambulatory Visit: Payer: Self-pay | Admitting: Internal Medicine

## 2019-06-06 ENCOUNTER — Other Ambulatory Visit: Payer: Self-pay

## 2019-06-06 ENCOUNTER — Ambulatory Visit (INDEPENDENT_AMBULATORY_CARE_PROVIDER_SITE_OTHER): Payer: 59 | Admitting: Certified Nurse Midwife

## 2019-06-06 ENCOUNTER — Encounter: Payer: Self-pay | Admitting: Certified Nurse Midwife

## 2019-06-06 VITALS — BP 120/80 | HR 86 | Temp 97.9°F | Wt 234.4 lb

## 2019-06-06 DIAGNOSIS — Z3403 Encounter for supervision of normal first pregnancy, third trimester: Secondary | ICD-10-CM

## 2019-06-06 LAB — POCT URINALYSIS DIPSTICK OB
Bilirubin, UA: NEGATIVE
Blood, UA: NEGATIVE
Glucose, UA: NEGATIVE
Leukocytes, UA: NEGATIVE
Nitrite, UA: NEGATIVE
Spec Grav, UA: 1.025 (ref 1.010–1.025)
Urobilinogen, UA: 0.2 E.U./dL
pH, UA: 6 (ref 5.0–8.0)

## 2019-06-06 NOTE — Patient Instructions (Signed)
Braxton Hicks Contractions Contractions of the uterus can occur throughout pregnancy, but they are not always a sign that you are in labor. You may have practice contractions called Braxton Hicks contractions. These false labor contractions are sometimes confused with true labor. What are Braxton Hicks contractions? Braxton Hicks contractions are tightening movements that occur in the muscles of the uterus before labor. Unlike true labor contractions, these contractions do not result in opening (dilation) and thinning of the cervix. Toward the end of pregnancy (32-34 weeks), Braxton Hicks contractions can happen more often and may become stronger. These contractions are sometimes difficult to tell apart from true labor because they can be very uncomfortable. You should not feel embarrassed if you go to the hospital with false labor. Sometimes, the only way to tell if you are in true labor is for your health care provider to look for changes in the cervix. The health care provider will do a physical exam and may monitor your contractions. If you are not in true labor, the exam should show that your cervix is not dilating and your water has not broken. If there are no other health problems associated with your pregnancy, it is completely safe for you to be sent home with false labor. You may continue to have Braxton Hicks contractions until you go into true labor. How to tell the difference between true labor and false labor True labor  Contractions last 30-70 seconds.  Contractions become very regular.  Discomfort is usually felt in the top of the uterus, and it spreads to the lower abdomen and low back.  Contractions do not go away with walking.  Contractions usually become more intense and increase in frequency.  The cervix dilates and gets thinner. False labor  Contractions are usually shorter and not as strong as true labor contractions.  Contractions are usually irregular.  Contractions  are often felt in the front of the lower abdomen and in the groin.  Contractions may go away when you walk around or change positions while lying down.  Contractions get weaker and are shorter-lasting as time goes on.  The cervix usually does not dilate or become thin. Follow these instructions at home:   Take over-the-counter and prescription medicines only as told by your health care provider.  Keep up with your usual exercises and follow other instructions from your health care provider.  Eat and drink lightly if you think you are going into labor.  If Braxton Hicks contractions are making you uncomfortable: ? Change your position from lying down or resting to walking, or change from walking to resting. ? Sit and rest in a tub of warm water. ? Drink enough fluid to keep your urine pale yellow. Dehydration may cause these contractions. ? Do slow and deep breathing several times an hour.  Keep all follow-up prenatal visits as told by your health care provider. This is important. Contact a health care provider if:  You have a fever.  You have continuous pain in your abdomen. Get help right away if:  Your contractions become stronger, more regular, and closer together.  You have fluid leaking or gushing from your vagina.  You pass blood-tinged mucus (bloody show).  You have bleeding from your vagina.  You have low back pain that you never had before.  You feel your baby's head pushing down and causing pelvic pressure.  Your baby is not moving inside you as much as it used to. Summary  Contractions that occur before labor are   called Braxton Hicks contractions, false labor, or practice contractions.  Braxton Hicks contractions are usually shorter, weaker, farther apart, and less regular than true labor contractions. True labor contractions usually become progressively stronger and regular, and they become more frequent.  Manage discomfort from Braxton Hicks contractions  by changing position, resting in a warm bath, drinking plenty of water, or practicing deep breathing. This information is not intended to replace advice given to you by your health care provider. Make sure you discuss any questions you have with your health care provider. Document Released: 03/25/2017 Document Revised: 10/22/2017 Document Reviewed: 03/25/2017 Elsevier Patient Education  2020 [employer] Inc[employer]  

## 2019-06-06 NOTE — Telephone Encounter (Signed)
I called hospital scheduling and got transferred and redirected several times to schedule a BPP for the patient. I was informed that I will receive a call back from Hospital scheduling with a date and time for pt. End of 06/06/19 @4 :36p still have not received call. Will call Morning of 06/07/19. [place]

## 2019-06-06 NOTE — Progress Notes (Signed)
ROB for problem visit. Pt state she has had a few episodes of diarrhea. She has not taken anything for it. She denies fever, or exposure to Covid. She has had a headache x 1 that resolved. She denies visual changes or epigastric pain. She had also notes getting dizzy when getting out of bed. Reviewed orthostatic hypotension. Discussed getting out of bed slowly. Red flag symptoms reviewed. Follow up 1 wk for BPP and ROB or sooner if needed.

## 2019-06-06 NOTE — Addendum Note (Signed)
Addended by: Hildred Priest on: 06/06/2019 10:33 AM   Modules accepted: Orders

## 2019-06-07 ENCOUNTER — Telehealth: Payer: Self-pay | Admitting: Certified Nurse Midwife

## 2019-06-07 NOTE — Telephone Encounter (Signed)
Pamala Hurry from [facility] scheduling called and informed me the patient will need her BPP scheduled at [facility] is closed today, and will open 06/08/19, Will call DP and reach out to pt on 06/08/19. [place]

## 2019-06-08 ENCOUNTER — Encounter: Payer: 59 | Admitting: Certified Nurse Midwife

## 2019-06-10 ENCOUNTER — Observation Stay
Admission: EM | Admit: 2019-06-10 | Discharge: 2019-06-11 | Disposition: A | Discharge status: Home / Self Care | Payer: 59 | Attending: Certified Nurse Midwife | Admitting: Certified Nurse Midwife

## 2019-06-10 ENCOUNTER — Other Ambulatory Visit: Payer: Self-pay

## 2019-06-10 DIAGNOSIS — Z79899 Other long term (current) drug therapy: Secondary | ICD-10-CM | POA: Insufficient documentation

## 2019-06-10 DIAGNOSIS — Z79890 Hormone replacement therapy (postmenopausal): Secondary | ICD-10-CM | POA: Insufficient documentation

## 2019-06-10 DIAGNOSIS — Z3A39 39 weeks gestation of pregnancy: Secondary | ICD-10-CM | POA: Diagnosis not present

## 2019-06-10 DIAGNOSIS — O99013 Anemia complicating pregnancy, third trimester: Principal | ICD-10-CM | POA: Insufficient documentation

## 2019-06-10 MED ORDER — BUTALBITAL-APAP-CAFFEINE 50-325-40 MG PO TABS
1.0000 | ORAL_TABLET | Freq: Once | ORAL | Status: AC
  Administered 2019-06-10: 1 via ORAL
  Filled 2019-06-10: qty 2

## 2019-06-10 NOTE — OB Triage Note (Signed)
Pt. Presented to L/D triage with reported headache since 1900. She took 500 mg tylenol at 2030 with no relief. She rates the pain 6/10. It is a constant, dull pain on the back of her head. No bleeding or LOF. Positive fetal movement. The patient notes the occasional contraction throughout the day. One possible beat of clonus on right food. No other PIH symptoms noted. VSS. Will continue to monitor.

## 2019-06-11 DIAGNOSIS — O26893 Other specified pregnancy related conditions, third trimester: Secondary | ICD-10-CM | POA: Diagnosis not present

## 2019-06-11 DIAGNOSIS — R51 Headache: Secondary | ICD-10-CM | POA: Diagnosis not present

## 2019-06-11 DIAGNOSIS — Z79899 Other long term (current) drug therapy: Secondary | ICD-10-CM | POA: Diagnosis not present

## 2019-06-11 DIAGNOSIS — Z3A39 39 weeks gestation of pregnancy: Secondary | ICD-10-CM | POA: Diagnosis not present

## 2019-06-11 DIAGNOSIS — O99013 Anemia complicating pregnancy, third trimester: Secondary | ICD-10-CM | POA: Diagnosis not present

## 2019-06-11 DIAGNOSIS — Z79890 Hormone replacement therapy (postmenopausal): Secondary | ICD-10-CM | POA: Diagnosis not present

## 2019-06-11 LAB — CBC
HCT: 31.4 % — ABNORMAL LOW (ref 36.0–46.0)
Hemoglobin: 10.4 g/dL — ABNORMAL LOW (ref 12.0–15.0)
MCH: 30.2 pg (ref 26.0–34.0)
MCHC: 33.1 g/dL (ref 30.0–36.0)
MCV: 91.3 fL (ref 80.0–100.0)
Platelets: 158 10*3/uL (ref 150–400)
RBC: 3.44 MIL/uL — ABNORMAL LOW (ref 3.87–5.11)
RDW: 13.5 % (ref 11.5–15.5)
WBC: 12.3 10*3/uL — ABNORMAL HIGH (ref 4.0–10.5)
nRBC: 0 % (ref 0.0–0.2)

## 2019-06-11 LAB — COMPREHENSIVE METABOLIC PANEL
ALT: 11 U/L (ref 0–44)
AST: 15 U/L (ref 15–41)
Albumin: 2.8 g/dL — ABNORMAL LOW (ref 3.5–5.0)
Alkaline Phosphatase: 159 U/L — ABNORMAL HIGH (ref 38–126)
Anion gap: 8 (ref 5–15)
BUN: 10 mg/dL (ref 6–20)
CO2: 20 mmol/L — ABNORMAL LOW (ref 22–32)
Calcium: 8.8 mg/dL — ABNORMAL LOW (ref 8.9–10.3)
Chloride: 105 mmol/L (ref 98–111)
Creatinine, Ser: 0.57 mg/dL (ref 0.44–1.00)
GFR calc Af Amer: >60 mL/min (ref >60)
GFR calc non Af Amer: >60 mL/min (ref >60)
Glucose, Bld: 111 mg/dL — ABNORMAL HIGH (ref 70–99)
Potassium: 3.5 mmol/L (ref 3.5–5.1)
Sodium: 133 mmol/L — ABNORMAL LOW (ref 135–145)
Total Bilirubin: 0.3 mg/dL (ref 0.3–1.2)
Total Protein: 5.9 g/dL — ABNORMAL LOW (ref 6.5–8.1)

## 2019-06-11 LAB — PROTEIN / CREATININE RATIO, URINE
Creatinine, Urine: 80 mg/dL
Protein Creatinine Ratio: 0.09 mg/mg{Cre} (ref 0.00–0.15)
Total Protein, Urine: 7 mg/dL

## 2019-06-11 MED ORDER — BUTALBITAL-APAP-CAFFEINE 50-325-40 MG PO TABS: 1.0000 | ORAL_TABLET | Freq: Four times a day (QID) | ORAL | 0 refills | Status: DC | PRN

## 2019-06-11 MED ORDER — FUSION PLUS PO CAPS: 1.0000 | ORAL_CAPSULE | Freq: Every day | ORAL | 4 refills | Status: DC

## 2019-06-11 NOTE — OB Triage Note (Signed)
Second nurse verified, no beat of clonus.

## 2019-06-11 NOTE — OB Triage Note (Signed)
L&D OB Triage Note  SUBJECTIVE Nolene EbbsCaitlin Deterding is a 28 y.o. G1P0000 female at 3743w5d, EDD Estimated Date of Delivery: 06/13/19 who presented to triage with complaints of headache. She denies visual changes and epigastric pain. Feels good movement. Denies LOF , bleeding , and contractions.   OB History  Gravida Para Term Preterm AB Living  1 0 0 0 0 0  SAB TAB Ectopic Multiple Live Births  0 0 0 0 0    # Outcome Date GA Lbr Len/2nd Weight Sex Delivery Anes PTL Lv  1 Current             Medications Prior to Admission  Medication Sig Dispense Refill Last Dose  . hydrocortisone-pramoxine (PROCTOFOAM HC) rectal foam Place 1 applicator rectally 2 (two) times daily. 10 g 1 Past Week at Unknown time  . Prenat-Fe Carbonyl-FA-Omega 3 (ONE-A-DAY WOMENS PRENATAL 1 PO)    06/10/2019 at Unknown time  . SYNTHROID 88 MCG tablet TAKE 1 TABLET BY MOUTH EVERY DAY 90 tablet 0 06/10/2019 at Unknown time     OBJECTIVE  Nursing Evaluation:   BP 121/86   Pulse 78   Temp 97.7 F (36.5 C) (Oral)   Resp 18   Ht 5\' 6"  (1.676 m)   Wt 104.3 kg   LMP 08/23/2018 (Exact Date)   BMI 37.12 kg/m    Findings:  Labs negative for Pre Eclampsia  NST was performed and has been reviewed by me.  NST INTERPRETATION: Category I  Mode: External Baseline Rate (A): 130 bpm(fht) Variability: Moderate Accelerations: 15 x 15 Decelerations: None     Contraction Frequency (min): occ with UI CBC    Component Value Date/Time   WBC 12.3 (H) 06/10/2019 2348   RBC 3.44 (L) 06/10/2019 2348   HGB 10.4 (L) 06/10/2019 2348   HGB 12.0 03/09/2019 1118   HCT 31.4 (L) 06/10/2019 2348   HCT 36.4 03/09/2019 1118   PLT 158 06/10/2019 2348   PLT 178 03/09/2019 1118   MCV 91.3 06/10/2019 2348   MCV 96 03/09/2019 1118   MCV 93 08/05/2014 1742   MCH 30.2 06/10/2019 2348   MCHC 33.1 06/10/2019 2348   RDW 13.5 06/10/2019 2348   RDW 12.6 03/09/2019 1118   RDW 12.9 08/05/2014 1742   LYMPHSABS 2.7 08/05/2014 1742   MONOABS 1.1  (H) 08/05/2014 1742   EOSABS 1.0 (H) 08/05/2014 1742   BASOSABS 0.1 08/05/2014 1742   CMP Latest Ref Rng & Units 06/10/2019 08/05/2014  Glucose 70 - 99 mg/dL 161(W111(H) 79  BUN 6 - 20 mg/dL 10 11  Creatinine 9.600.44 - 1.00 mg/dL 4.540.57 0.980.81  Sodium 119135 - 145 mmol/L 133(L) 141  Potassium 3.5 - 5.1 mmol/L 3.5 4.0  Chloride 98 - 111 mmol/L 105 104  CO2 22 - 32 mmol/L 20(L) 27  Calcium 8.9 - 10.3 mg/dL 1.4(N8.8(L) 9.0  Total Protein 6.5 - 8.1 g/dL 5.9(L) 7.6  Total Bilirubin 0.3 - 1.2 mg/dL 0.3 0.3  Alkaline Phos 38 - 126 U/L 159(H) 71  AST 15 - 41 U/L 15 26  ALT 0 - 44 U/L 11 20   Creatinine, Urine mg/dL 80   Total Protein, Urine mg/dL 7   Comment: NO NORMAL RANGE ESTABLISHED FOR THIS TEST  Protein Creatinine Ratio 0.00 - 0.15 mg/mg 0.09     ASSESSMENT Impression:  1.  Pregnancy:  G1P0000 at 6643w5d , EDD Estimated Date of Delivery: 06/13/19 2.  NST:  Category I 3.Anemia- Start iron supplementation  PLAN 1. Reassurance  given, reviewed S&S pre Eclampsia.  2. Discharge home with standard labor precautions given to return to L&D or call the office for problems. 3. Continue routine prenatal care.    Philip Aspen, CNM

## 2019-06-12 ENCOUNTER — Telehealth: Payer: Self-pay

## 2019-06-12 NOTE — Telephone Encounter (Signed)
Coronavirus (COVID-19) Are you at risk?  Are you at risk for the Coronavirus (COVID-19)?  To be considered HIGH RISK for Coronavirus (COVID-19), you have to meet the following criteria:  . Traveled to [place], [place], [place], [place] or [place]; or in the [place] to [place], [place], [place], or [place]; and have fever, cough, and shortness of breath within the last 2 weeks of travel OR . Been in close contact with a person diagnosed with COVID-19 within the last 2 weeks and have fever, cough, and shortness of breath . IF YOU DO NOT MEET THESE CRITERIA, YOU ARE CONSIDERED LOW RISK FOR COVID-19.  What to do if you are HIGH RISK for COVID-19?  . If you are having a medical emergency, call 911. . Seek medical care right away. Before you go to a doctor's office, urgent care or emergency department, call ahead and tell them about your recent travel, contact with someone diagnosed with COVID-19, and your symptoms. You should receive instructions from your physician's office regarding next steps of care.  . When you arrive at healthcare provider, tell the healthcare staff immediately you have returned from visiting [place], [place], [place], [place] or [place]; or traveled in the [place] to [place], [place], [place], or [place]; in the last two weeks or you have been in close contact with a person diagnosed with COVID-19 in the last 2 weeks.   . Tell the health care staff about your symptoms: fever, cough and shortness of breath. . After you have been seen by a medical provider, you will be either: o Tested for (COVID-19) and discharged home on quarantine except to seek medical care if symptoms worsen, and asked to  - Stay home and avoid contact with others until you get your results (4-5 days)  - Avoid travel on public transportation if possible (such as bus, train, or airplane) or o Sent to the Emergency Department by EMS for evaluation, COVID-19 testing, and possible  admission depending on your condition and test results.  What to do if you are LOW RISK for COVID-19?  Reduce your risk of any infection by using the same precautions used for avoiding the common cold or flu:  . Wash your hands often with soap and warm water for at least 20 seconds.  If soap and water are not readily available, use an alcohol-based hand sanitizer with at least 60% alcohol.  . If coughing or sneezing, cover your mouth and nose by coughing or sneezing into the elbow areas of your shirt or coat, into a tissue or into your sleeve (not your hands). . Avoid shaking hands with others and consider head nods or verbal greetings only. . Avoid touching your eyes, nose, or mouth with unwashed hands.  . Avoid close contact with people who are [place]. . Avoid places or events with large numbers of people in one location, like concerts or sporting events. . Carefully consider travel plans you have or are making. . If you are planning any travel outside or inside the [place], visit the CDC's Travelers' Health webpage for the latest health notices. . If you have some symptoms but not all symptoms, continue to monitor at home and seek medical attention if your symptoms worsen. . If you are having a medical emergency, call 911.  06/12/19 SCREENING NEG SLS ADDITIONAL HEALTHCARE OPTIONS FOR PATIENTS  [facility] / e-Visit: [email or web address]         [facility]: [phone]    [facility]: [phone]                   [facility]: [phone]  

## 2019-06-13 ENCOUNTER — Ambulatory Visit
Admission: RE | Admit: 2019-06-13 | Discharge: 2019-06-13 | Disposition: A | Discharge status: Home / Self Care | Payer: 59 | Source: Ambulatory Visit | Attending: Certified Nurse Midwife | Admitting: Certified Nurse Midwife

## 2019-06-13 ENCOUNTER — Ambulatory Visit (INDEPENDENT_AMBULATORY_CARE_PROVIDER_SITE_OTHER): Payer: 59 | Admitting: Certified Nurse Midwife

## 2019-06-13 ENCOUNTER — Encounter: Payer: Self-pay | Admitting: Certified Nurse Midwife

## 2019-06-13 ENCOUNTER — Other Ambulatory Visit: Payer: Self-pay

## 2019-06-13 ENCOUNTER — Other Ambulatory Visit: Payer: Self-pay | Admitting: Certified Nurse Midwife

## 2019-06-13 VITALS — BP 117/77 | HR 94 | Wt 236.4 lb

## 2019-06-13 DIAGNOSIS — Z3403 Encounter for supervision of normal first pregnancy, third trimester: Secondary | ICD-10-CM | POA: Diagnosis not present

## 2019-06-13 DIAGNOSIS — O480 Post-term pregnancy: Secondary | ICD-10-CM | POA: Diagnosis not present

## 2019-06-13 DIAGNOSIS — O288 Other abnormal findings on antenatal screening of mother: Secondary | ICD-10-CM

## 2019-06-13 DIAGNOSIS — Z01812 Encounter for preprocedural laboratory examination: Secondary | ICD-10-CM

## 2019-06-13 DIAGNOSIS — Z3A00 Weeks of gestation of pregnancy not specified: Secondary | ICD-10-CM | POA: Diagnosis not present

## 2019-06-13 NOTE — Progress Notes (Signed)
ROB, doing well BPP today 8/8 ( see below) SVE 1/50/-2. Discussed NST Friday and Foley bulb placement on Monday afternoon. She verbalizes and agrees to plan of care.   CLINICAL DATA:  Post dates.  EXAM: LIMITED OBSTETRIC ULTRASOUND AND BIOPHYSICAL PROFILE  FINDINGS: Number of Fetuses: 1  Heart Rate:  117 bpm  Movement: Present  Presentation: Cephalic  Placenta poorly visualized.  AFI: 14.5 cm  Movement:  2  Time: 15 minutes  Breathing: 2  Tone:  2  Amniotic Fluid: 2  Total Score:  8  IMPRESSION: Single viable intrauterine pregnancy in cephalic presentation. Fetal heart rate 117 beats per minute.  Biophysical profile score is 8 out of 8.  This study was limited due to gestational age.   Electronically Signed   By: Marcello Moores  Register   On: 06/13/2019 15:17

## 2019-06-13 NOTE — Patient Instructions (Signed)

## 2019-06-14 ENCOUNTER — Other Ambulatory Visit: Payer: Self-pay | Admitting: Obstetrics and Gynecology

## 2019-06-16 ENCOUNTER — Other Ambulatory Visit: Payer: Self-pay

## 2019-06-16 ENCOUNTER — Ambulatory Visit (INDEPENDENT_AMBULATORY_CARE_PROVIDER_SITE_OTHER): Payer: 59 | Admitting: Certified Nurse Midwife

## 2019-06-16 ENCOUNTER — Other Ambulatory Visit: Payer: 59

## 2019-06-16 ENCOUNTER — Other Ambulatory Visit
Admission: RE | Admit: 2019-06-16 | Discharge: 2019-06-16 | Disposition: A | Discharge status: Home / Self Care | Payer: 59 | Source: Ambulatory Visit | Attending: Certified Nurse Midwife | Admitting: Certified Nurse Midwife

## 2019-06-16 VITALS — BP 127/77 | HR 76 | Wt 236.2 lb

## 2019-06-16 DIAGNOSIS — O480 Post-term pregnancy: Secondary | ICD-10-CM

## 2019-06-16 DIAGNOSIS — Z3493 Encounter for supervision of normal pregnancy, unspecified, third trimester: Secondary | ICD-10-CM

## 2019-06-16 DIAGNOSIS — Z1159 Encounter for screening for other viral diseases: Secondary | ICD-10-CM | POA: Diagnosis not present

## 2019-06-16 LAB — POCT URINALYSIS DIPSTICK OB
Bilirubin, UA: NEGATIVE
Blood, UA: NEGATIVE
Glucose, UA: NEGATIVE
Ketones, UA: NEGATIVE
Nitrite, UA: NEGATIVE
POC,PROTEIN,UA: NEGATIVE
Spec Grav, UA: 1.015 (ref 1.010–1.025)
Urobilinogen, UA: 0.2 E.U./dL
pH, UA: 6.5 (ref 5.0–8.0)

## 2019-06-16 NOTE — Patient Instructions (Signed)

## 2019-06-16 NOTE — Progress Notes (Signed)
ROB & NST-Reports intermittent lower pelvic pressure and cramping with loss of mucus plug yesterday. Declines cervical exam. Anticipatory guidance regarding postdates care. Reviewed red flag symptoms and when to call. RTC x Monday for NST and foley bulb placement or sooner if needed.    NONSTRESS TEST INTERPRETATION  INDICATIONS: Postdates  FHR baseline: 120 bpm RESULTS:Reactive COMMENTS: Occasional contractions   PLAN: 1. Continue fetal kick counts twice a day. 2. Continue antepartum testing as scheduled   Diona Fanti, CNM [facility], [facility] 06/16/19 3:53 PM

## 2019-06-17 LAB — SARS CORONAVIRUS 2 (TAT 6-24 HRS): SARS Coronavirus 2: NEGATIVE

## 2019-06-18 ENCOUNTER — Inpatient Hospital Stay: Admission: AD | Admit: 2019-06-18 | Payer: 59 | Source: Home / Self Care

## 2019-06-19 ENCOUNTER — Other Ambulatory Visit: Payer: Self-pay

## 2019-06-19 ENCOUNTER — Inpatient Hospital Stay
Admission: EM | Admit: 2019-06-19 | Discharge: 2019-06-22 | DRG: 807 | Disposition: A | Discharge status: Home / Self Care | Payer: 59 | Attending: Certified Nurse Midwife | Admitting: Certified Nurse Midwife

## 2019-06-19 ENCOUNTER — Ambulatory Visit (INDEPENDENT_AMBULATORY_CARE_PROVIDER_SITE_OTHER): Payer: 59 | Admitting: Certified Nurse Midwife

## 2019-06-19 VITALS — BP 139/84 | HR 111

## 2019-06-19 DIAGNOSIS — O480 Post-term pregnancy: Secondary | ICD-10-CM

## 2019-06-19 DIAGNOSIS — Z3A40 40 weeks gestation of pregnancy: Secondary | ICD-10-CM | POA: Diagnosis not present

## 2019-06-19 DIAGNOSIS — Z3493 Encounter for supervision of normal pregnancy, unspecified, third trimester: Secondary | ICD-10-CM

## 2019-06-19 DIAGNOSIS — Z3A41 41 weeks gestation of pregnancy: Secondary | ICD-10-CM

## 2019-06-19 DIAGNOSIS — O99284 Endocrine, nutritional and metabolic diseases complicating childbirth: Secondary | ICD-10-CM | POA: Diagnosis present

## 2019-06-19 DIAGNOSIS — Z3490 Encounter for supervision of normal pregnancy, unspecified, unspecified trimester: Secondary | ICD-10-CM | POA: Diagnosis present

## 2019-06-19 DIAGNOSIS — E039 Hypothyroidism, unspecified: Secondary | ICD-10-CM | POA: Diagnosis present

## 2019-06-19 LAB — POCT URINALYSIS DIPSTICK OB
Bilirubin, UA: NEGATIVE
Blood, UA: NEGATIVE
Glucose, UA: NEGATIVE
Ketones, UA: NEGATIVE
Leukocytes, UA: NEGATIVE
Nitrite, UA: NEGATIVE
Spec Grav, UA: 1.025 (ref 1.010–1.025)
Urobilinogen, UA: 0.2 E.U./dL
pH, UA: 6 (ref 5.0–8.0)

## 2019-06-19 NOTE — Progress Notes (Signed)
FOLEY BULB PROCEDURE NOTE:   I have verified that the patient is an appropriate candidate for outpatient foley bulb placement.  She has consented to foley bulb placement today. She has no concerns at this time. A pre-procedure NST performed today was reviewed and was found to be reactive.    The patient was placed in the dorsal lithotomy position.  A speculum was inserted into the vagina and the cervix was visualized. The cervix was noted to be 1 cm dilated  A foley catheter was advanced into the cervix slightly pass the internal cervical os.  The foley balloon was filled with 30 ml of normal saline. Gentle traction was placed on the foley bulb, and the catheter was taped to the medial portion of the thigh.  The patient tolerated the procedure well.  A post-procedure NST was performed. Sterile technique was observed throughout.    NONSTRESS TEST INTERPRETATION  INDICATIONS: Foley bulb induction placement  FHR baseline: 125 bpm (pre-procedure) and 120  bpm (post procedure) RESULTS:Reactive x 2.  COMMENTS:  Irregular contractions   PLAN: 1. To arrive at Labor and Delivery for scheduled induction of labor on 06/20/2019 at 0001.  She was given post-procedure instructions including red flag symptoms and when to call.

## 2019-06-19 NOTE — Progress Notes (Signed)
Patient here for foley bulb placement, c/o intermittent lower abdominal pain and pressure x1 week.

## 2019-06-19 NOTE — Patient Instructions (Addendum)
OUTPATIENT FOLEY BULB INDUCTION OF LABOR:  Information Sheet for Mothers and Family               What's a Foley Bulb Induction?   A Foley bulb induction is a procedure where your provider inserts a catheter into your cervix. Once inside your womb, your provider inflates the balloon with a saline solution.  This puts pressure on your cervix and encourages dilation. The catheter falls out once your cervix dilates to 3-4 centimeters.  Your provider will likely use this method in conjunction with labor-inducing medications such as Pitocin. With any procedure, it's important that you know what to expect. The insertion of a Foley catheter can be a bit uncomfortable, and some women experience sharp pelvic pain. The pain may subside once the catheter is in place. You may experience some cramping when the Foley catheter is in place.  This is normal.     GO TO LABOR AND DELIVERY FOR THE FOLLOWING: . Heavy vaginal bleeding . Rupture of membranes (fluid that wets your underwear) . Painful uterine contractions every 5 minutes or less . Severe abdominal discomfort . Decreased movement of the baby         Labor Induction  Labor induction is when steps are taken to cause a pregnant woman to begin the labor process. Most women go into labor on their own between 37 weeks and 42 weeks of pregnancy. When this does not happen or when there is a medical need for labor to begin, steps may be taken to induce labor. Labor induction causes a pregnant woman's uterus to contract. It also causes the cervix to soften (ripen), open (dilate), and thin out (efface). Usually, labor is not induced before 39 weeks of pregnancy unless there is a medical reason to do so. Your health care provider will determine if labor induction is needed. Before inducing labor, your health care provider will consider a number of factors, including:  Your medical condition and your baby's.  How many weeks along you are in your  pregnancy.  How mature your baby's lungs are.  The condition of your cervix.  The position of your baby.  The size of your birth canal. What are some reasons for labor induction? Labor may be induced if:  Your health or your baby's health is at risk.  Your pregnancy is overdue by 1 week or more.  Your water breaks but labor does not start on its own.  There is a low amount of amniotic fluid around your baby. You may also choose (elect) to have labor induced at a certain time. Generally, elective labor induction is done no earlier than 39 weeks of pregnancy. What methods are used for labor induction? Methods used for labor induction include:  Prostaglandin medicine. This medicine starts contractions and causes the cervix to dilate and ripen. It can be taken by mouth (orally) or by being inserted into the vagina (suppository).  Inserting a small, thin tube (catheter) with a balloon into the vagina and then expanding the balloon with water to dilate the cervix.  Stripping the membranes. In this method, your health care provider gently separates amniotic sac tissue from the cervix. This causes the cervix to stretch, which in turn causes the release of a hormone called progesterone. The hormone causes the uterus to contract. This procedure is often done during an office visit, after which you will be sent home to wait for contractions to begin.  Breaking the water. In this  method, your health care provider uses a small instrument to make a small hole in the amniotic sac. This eventually causes the amniotic sac to break. Contractions should begin after a few hours.  Medicine to trigger or strengthen contractions. This medicine is given through an IV that is inserted into a vein in your arm. Except for membrane stripping, which can be done in a clinic, labor induction is done in the hospital so that you and your baby can be carefully monitored. How long does it take for labor to be induced?  The length of time it takes to induce labor depends on how ready your body is for labor. Some inductions can take up to 2-3 days, while others may take less than a day. Induction may take longer if:  You are induced early in your pregnancy.  It is your first pregnancy.  Your cervix is not ready. What are some risks associated with labor induction? Some risks associated with labor induction include:  Changes in fetal heart rate, such as being too high, too low, or irregular (erratic).  Failed induction.  Infection in the mother or the baby.  Increased risk of having a cesarean delivery.  Fetal death.  Breaking off (abruption) of the placenta from the uterus (rare).  Rupture of the uterus (very rare). When induction is needed for medical reasons, the benefits of induction generally outweigh the risks. What are some reasons for not inducing labor? Labor induction should not be done if:  Your baby does not tolerate contractions.  You have had previous surgeries on your uterus, such as a myomectomy, removal of fibroids, or a vertical scar from a previous cesarean delivery.  Your placenta lies very low in your uterus and blocks the opening of the cervix (placenta previa).  Your baby is not in a head-down position.  The umbilical cord drops down into the birth canal in front of the baby.  There are unusual circumstances, such as the baby being very early (premature).  You have had more than 2 previous cesarean deliveries. Summary  Labor induction is when steps are taken to cause a pregnant woman to begin the labor process.  Labor induction causes a pregnant woman's uterus to contract. It also causes the cervix to ripen, dilate, and efface.  Labor is not induced before 39 weeks of pregnancy unless there is a medical reason to do so.  When induction is needed for medical reasons, the benefits of induction generally outweigh the risks. This information is not intended to  replace advice given to you by your health care provider. Make sure you discuss any questions you have with your health care provider. Document Released: 03/31/2007 Document Revised: 11/12/2017 Document Reviewed: 12/23/2016 Elsevier Patient Education  2020 [employer].

## 2019-06-20 ENCOUNTER — Inpatient Hospital Stay: Payer: 59 | Admitting: Anesthesiology

## 2019-06-20 DIAGNOSIS — Z3490 Encounter for supervision of normal pregnancy, unspecified, unspecified trimester: Secondary | ICD-10-CM | POA: Diagnosis present

## 2019-06-20 DIAGNOSIS — O480 Post-term pregnancy: Secondary | ICD-10-CM | POA: Diagnosis not present

## 2019-06-20 DIAGNOSIS — Z3A41 41 weeks gestation of pregnancy: Secondary | ICD-10-CM

## 2019-06-20 DIAGNOSIS — O99284 Endocrine, nutritional and metabolic diseases complicating childbirth: Secondary | ICD-10-CM | POA: Diagnosis not present

## 2019-06-20 DIAGNOSIS — E039 Hypothyroidism, unspecified: Secondary | ICD-10-CM | POA: Diagnosis not present

## 2019-06-20 LAB — COMPREHENSIVE METABOLIC PANEL
ALT: 12 U/L (ref 0–44)
AST: 26 U/L (ref 15–41)
Albumin: 3 g/dL — ABNORMAL LOW (ref 3.5–5.0)
Alkaline Phosphatase: 202 U/L — ABNORMAL HIGH (ref 38–126)
Anion gap: 9 (ref 5–15)
BUN: 12 mg/dL (ref 6–20)
CO2: 20 mmol/L — ABNORMAL LOW (ref 22–32)
Calcium: 9.4 mg/dL (ref 8.9–10.3)
Chloride: 106 mmol/L (ref 98–111)
Creatinine, Ser: 0.59 mg/dL (ref 0.44–1.00)
GFR calc Af Amer: >60 mL/min (ref >60)
GFR calc non Af Amer: >60 mL/min (ref >60)
Glucose, Bld: 96 mg/dL (ref 70–99)
Potassium: 4.2 mmol/L (ref 3.5–5.1)
Sodium: 135 mmol/L (ref 135–145)
Total Bilirubin: 0.5 mg/dL (ref 0.3–1.2)
Total Protein: 6.6 g/dL (ref 6.5–8.1)

## 2019-06-20 LAB — TYPE AND SCREEN
ABO/RH(D): A POS
Antibody Screen: NEGATIVE

## 2019-06-20 LAB — CBC
HCT: 38.7 % (ref 36.0–46.0)
Hemoglobin: 12.9 g/dL (ref 12.0–15.0)
MCH: 30.4 pg (ref 26.0–34.0)
MCHC: 33.3 g/dL (ref 30.0–36.0)
MCV: 91.3 fL (ref 80.0–100.0)
Platelets: 172 10*3/uL (ref 150–400)
RBC: 4.24 MIL/uL (ref 3.87–5.11)
RDW: 14.8 % (ref 11.5–15.5)
WBC: 12.9 10*3/uL — ABNORMAL HIGH (ref 4.0–10.5)
nRBC: 0 % (ref 0.0–0.2)

## 2019-06-20 MED ORDER — FENTANYL 2.5 MCG/ML W/ROPIVACAINE 0.15% IN NS 100 ML EPIDURAL (ARMC)
EPIDURAL | Status: DC | PRN
  Administered 2019-06-20: 12 mL/h via EPIDURAL

## 2019-06-20 MED ORDER — AMMONIA AROMATIC IN INHA
RESPIRATORY_TRACT | Status: AC
  Filled 2019-06-20: qty 10

## 2019-06-20 MED ORDER — OXYCODONE-ACETAMINOPHEN 5-325 MG PO TABS: 2.0000 | ORAL_TABLET | ORAL | Status: DC | PRN

## 2019-06-20 MED ORDER — FENTANYL 2.5 MCG/ML W/ROPIVACAINE 0.15% IN NS 100 ML EPIDURAL (ARMC)
EPIDURAL | Status: AC
  Filled 2019-06-20: qty 100

## 2019-06-20 MED ORDER — LEVOTHYROXINE SODIUM 88 MCG PO TABS
88.0000 ug | ORAL_TABLET | Freq: Every day | ORAL | Status: DC
  Administered 2019-06-21 – 2019-06-22 (×2): 88 ug via ORAL
  Filled 2019-06-20 (×3): qty 1

## 2019-06-20 MED ORDER — LACTATED RINGERS IV SOLN: 500.0000 mL | INTRAVENOUS | Status: DC | PRN

## 2019-06-20 MED ORDER — OXYTOCIN 40 UNITS IN NORMAL SALINE INFUSION - SIMPLE MED
2.5000 [IU]/h | INTRAVENOUS | Status: DC
  Administered 2019-06-20: 2.5 [IU]/h via INTRAVENOUS
  Filled 2019-06-20: qty 1000

## 2019-06-20 MED ORDER — MISOPROSTOL 50MCG HALF TABLET: 50.0000 ug | ORAL_TABLET | ORAL | Status: DC

## 2019-06-20 MED ORDER — MISOPROSTOL 200 MCG PO TABS
ORAL_TABLET | ORAL | Status: AC
  Filled 2019-06-20: qty 4

## 2019-06-20 MED ORDER — DIBUCAINE (PERIANAL) 1 % EX OINT: 1.0000 "application " | TOPICAL_OINTMENT | CUTANEOUS | Status: DC | PRN

## 2019-06-20 MED ORDER — COCONUT OIL OIL: 1.0000 "application " | TOPICAL_OIL | Status: DC | PRN

## 2019-06-20 MED ORDER — ZOLPIDEM TARTRATE 5 MG PO TABS: 5.0000 mg | ORAL_TABLET | Freq: Every evening | ORAL | Status: DC | PRN

## 2019-06-20 MED ORDER — BENZOCAINE-MENTHOL 20-0.5 % EX AERO: 1.0000 "application " | INHALATION_SPRAY | CUTANEOUS | Status: DC | PRN

## 2019-06-20 MED ORDER — LIDOCAINE-EPINEPHRINE (PF) 1.5 %-1:200000 IJ SOLN
INTRAMUSCULAR | Status: DC | PRN
  Administered 2019-06-20: 3 mL via EPIDURAL

## 2019-06-20 MED ORDER — OXYCODONE-ACETAMINOPHEN 5-325 MG PO TABS: 1.0000 | ORAL_TABLET | ORAL | Status: DC | PRN

## 2019-06-20 MED ORDER — WITCH HAZEL-GLYCERIN EX PADS: 1.0000 "application " | MEDICATED_PAD | CUTANEOUS | Status: DC | PRN

## 2019-06-20 MED ORDER — ACETAMINOPHEN 325 MG PO TABS
650.0000 mg | ORAL_TABLET | ORAL | Status: DC | PRN
  Administered 2019-06-20: 18:00:00 650 mg via ORAL

## 2019-06-20 MED ORDER — SOD CITRATE-CITRIC ACID 500-334 MG/5ML PO SOLN: 30.0000 mL | ORAL | Status: DC | PRN

## 2019-06-20 MED ORDER — DOCUSATE SODIUM 100 MG PO CAPS
100.0000 mg | ORAL_CAPSULE | Freq: Two times a day (BID) | ORAL | Status: DC
  Administered 2019-06-20 – 2019-06-22 (×4): 100 mg via ORAL
  Filled 2019-06-20 (×4): qty 1

## 2019-06-20 MED ORDER — OXYTOCIN 10 UNIT/ML IJ SOLN
INTRAMUSCULAR | Status: AC
  Filled 2019-06-20: qty 2

## 2019-06-20 MED ORDER — PRENATAL MULTIVITAMIN CH
1.0000 | ORAL_TABLET | Freq: Every day | ORAL | Status: DC
  Administered 2019-06-21 – 2019-06-22 (×2): 1 via ORAL
  Filled 2019-06-20 (×2): qty 1

## 2019-06-20 MED ORDER — VARICELLA VIRUS VACCINE LIVE 1350 PFU/0.5ML IJ SUSR
0.5000 mL | Freq: Once | INTRAMUSCULAR | Status: AC
  Administered 2019-06-22: 0.5 mL via SUBCUTANEOUS
  Filled 2019-06-20 (×2): qty 0.5

## 2019-06-20 MED ORDER — LIDOCAINE HCL (PF) 1 % IJ SOLN
INTRAMUSCULAR | Status: DC | PRN
  Administered 2019-06-20: 4 mL

## 2019-06-20 MED ORDER — SODIUM CHLORIDE 0.9 % IV SOLN: 250.0000 mL | INTRAVENOUS | Status: DC | PRN

## 2019-06-20 MED ORDER — ONDANSETRON HCL 4 MG PO TABS: 4.0000 mg | ORAL_TABLET | ORAL | Status: DC | PRN

## 2019-06-20 MED ORDER — LACTATED RINGERS IV SOLN
INTRAVENOUS | Status: DC
  Administered 2019-06-20 (×2): via INTRAVENOUS

## 2019-06-20 MED ORDER — ACETAMINOPHEN 325 MG PO TABS
ORAL_TABLET | ORAL | Status: AC
  Filled 2019-06-20: qty 2

## 2019-06-20 MED ORDER — BUTORPHANOL TARTRATE 2 MG/ML IJ SOLN
1.0000 mg | INTRAMUSCULAR | Status: DC | PRN
  Administered 2019-06-20 (×2): 1 mg via INTRAVENOUS
  Filled 2019-06-20 (×2): qty 1

## 2019-06-20 MED ORDER — ACETAMINOPHEN 325 MG PO TABS: 650.0000 mg | ORAL_TABLET | ORAL | Status: DC | PRN

## 2019-06-20 MED ORDER — LIDOCAINE HCL (PF) 1 % IJ SOLN: 30.0000 mL | INTRAMUSCULAR | Status: DC | PRN

## 2019-06-20 MED ORDER — LIDOCAINE HCL (PF) 1 % IJ SOLN
INTRAMUSCULAR | Status: AC
  Filled 2019-06-20: qty 30

## 2019-06-20 MED ORDER — OXYTOCIN BOLUS FROM INFUSION
500.0000 mL | Freq: Once | INTRAVENOUS | Status: AC
  Administered 2019-06-20: 500 mL via INTRAVENOUS

## 2019-06-20 MED ORDER — ONDANSETRON HCL 4 MG/2ML IJ SOLN
4.0000 mg | Freq: Four times a day (QID) | INTRAMUSCULAR | Status: DC | PRN
  Administered 2019-06-20: 03:00:00 4 mg via INTRAVENOUS
  Filled 2019-06-20: qty 2

## 2019-06-20 MED ORDER — SODIUM CHLORIDE 0.9% FLUSH: 3.0000 mL | Freq: Two times a day (BID) | INTRAVENOUS | Status: DC

## 2019-06-20 MED ORDER — IBUPROFEN 600 MG PO TABS
600.0000 mg | ORAL_TABLET | Freq: Four times a day (QID) | ORAL | Status: DC
  Administered 2019-06-20 – 2019-06-22 (×8): 600 mg via ORAL
  Filled 2019-06-20 (×8): qty 1

## 2019-06-20 MED ORDER — TERBUTALINE SULFATE 1 MG/ML IJ SOLN: 0.2500 mg | Freq: Once | INTRAMUSCULAR | Status: DC | PRN

## 2019-06-20 MED ORDER — SODIUM CHLORIDE 0.9 % IV SOLN
INTRAVENOUS | Status: DC | PRN
  Administered 2019-06-20 (×2): 5 mL via EPIDURAL

## 2019-06-20 MED ORDER — DIPHENHYDRAMINE HCL 25 MG PO CAPS: 25.0000 mg | ORAL_CAPSULE | Freq: Four times a day (QID) | ORAL | Status: DC | PRN

## 2019-06-20 MED ORDER — SODIUM CHLORIDE 0.9% FLUSH: 3.0000 mL | INTRAVENOUS | Status: DC | PRN

## 2019-06-20 MED ORDER — ONDANSETRON HCL 4 MG/2ML IJ SOLN: 4.0000 mg | INTRAMUSCULAR | Status: DC | PRN

## 2019-06-20 MED ORDER — OXYTOCIN 40 UNITS IN NORMAL SALINE INFUSION - SIMPLE MED
1.0000 m[IU]/min | INTRAVENOUS | Status: DC
  Administered 2019-06-20: 08:00:00 2 m[IU]/min via INTRAVENOUS
  Filled 2019-06-20: qty 1000

## 2019-06-20 MED ORDER — SIMETHICONE 80 MG PO CHEW: 80.0000 mg | CHEWABLE_TABLET | ORAL | Status: DC | PRN

## 2019-06-20 NOTE — Lactation Note (Signed)
This note was copied from a baby's chart. Lactation Consultation Note  Patient Name: Boy Lucianne Smestad JFHLK'T Date: 06/20/2019   [facility] assisted mom and baby boy Danne Baxter with breastfeeding after delivery. Danne Baxter was already placed in cradle position with tummy turned in, rooting. Danne Baxter had a wide open mouth, LC held breast tissue to help Danne Baxter obtain a deeper latch. Danne Baxter was able to grasp breast easily, and sustain latch after LC released the breast tissue.  He had rhythmic sucking after a few seconds, and continued to breastfeed well. Reviewed with mom early hunger cues, newborn stomach size, feeding behaviors for newborns, and wet/stool diapers for the first 24 hours. LC showed mom how to hand express. Encouraged to feed on cue, and place baby skin to skin when possible.  Maternal Data Has patient been taught Hand Expression?: Yes  Feeding Feeding Type: Breast Fed  LATCH Score Latch: Grasps breast easily, tongue down, lips flanged, rhythmical sucking.  Audible Swallowing: Spontaneous and intermittent  Type of Nipple: Everted at rest and after stimulation  Comfort (Breast/Nipple): Soft / non-tender  Hold (Positioning): Assistance needed to correctly position infant at breast and maintain latch.  LATCH Score: 9  Interventions Interventions: Assisted with latch;Breast feeding basics reviewed  Lactation Tools Discussed/Used     Consult Status      Lavonia Drafts 06/20/2019, 4:58 PM

## 2019-06-20 NOTE — Progress Notes (Signed)
Julie Rowe is a 28 y.o. G1P0000 at [redacted]w[redacted]d by LMP admitted for induction of labor due to Post dates.  Subjective: Reports mild pressure with contractions since epidural placed  Objective: BP 104/60 (BP Location: Left Arm)   Pulse 84   Temp 98.1 F (36.7 C) (Oral)   Resp 16   Ht 5\' 6"  (1.676 m)   Wt 106.1 kg   LMP 08/23/2018 (Exact Date)   SpO2 99%   BMI 37.77 kg/m  I/O last 3 completed shifts: In: 448.5 [I.V.:448.5] Out: -  No intake/output data recorded.  FHT:  FHR: 129 bpm, variability: moderate,  accelerations:  Present,  decelerations:  Absent UC:   irregular, every 3-6 minutes SVE:   Dilation: 8 Effacement (%): 90 Station: -1 Exam by:: Shambley CNM Foley placed, will start pitocin if contraction pattern dictates Labs: Lab Results  Component Value Date   WBC 12.9 (H) 06/20/2019   HGB 12.9 06/20/2019   HCT 38.7 06/20/2019   MCV 91.3 06/20/2019   PLT 172 06/20/2019    Assessment / Plan: Spontaneous labor, progressing normally, fetus not well applied to cervix, feels OP position, will side lying with peanut ball.  Labor: Progressing normally Preeclampsia:  labs stable Fetal Wellbeing:  Category I Pain Control:  Epidural I/D:  n/a Anticipated MOD:  NSVD  Melody N Shambley 06/20/2019, 7:27 AM

## 2019-06-20 NOTE — Progress Notes (Signed)
Julie Rowe is a 28 y.o. G1P0000 at [redacted]w[redacted]d by LMP admitted for induction of labor due to Post dates.  Subjective: Reports intense rectal pressure with contractions for the last 30 minutes.  Objective: BP (!) 99/54 (BP Location: Left Arm)   Pulse 93   Temp 98.5 F (36.9 C) (Oral)   Resp 16   Ht 5\' 6"  (1.676 m)   Wt 106.1 kg   LMP 08/23/2018 (Exact Date)   SpO2 99%   BMI 37.77 kg/m  I/O last 3 completed shifts: In: 448.5 [I.V.:448.5] Out: -  Total I/O In: 1068 [I.V.:1068] Out: -   FHT:  FHR: 110 bpm, variability: moderate,  accelerations:  Present,  decelerations:  Absent UC:   regular, every 3-5 minutes on 60mu/min pitocin SVE:   Dilation: 10 Effacement (%): 100 Station: -1 Exam by:: [employer] CNM  Labs: Lab Results  Component Value Date   WBC 12.9 (H) 06/20/2019   HGB 12.9 06/20/2019   HCT 38.7 06/20/2019   MCV 91.3 06/20/2019   PLT 172 06/20/2019    Assessment / Plan: Induction of labor due to postterm,  progressing well on pitocin  Labor: Progressing normally Preeclampsia:  labs stable Fetal Wellbeing:  Category I Pain Control:  Epidural I/D:  n/a Anticipated MOD:  NSVD  Melody N Shambley 06/20/2019, 12:22 PM

## 2019-06-20 NOTE — Anesthesia Procedure Notes (Signed)
Epidural Patient location during procedure: OB Start time: 06/20/2019 6:35 AM End time: 06/20/2019 6:44 AM  Staffing Anesthesiologist: Martha Clan, MD Performed: anesthesiologist   Preanesthetic Checklist Completed: patient identified, site marked, surgical consent, pre-op evaluation, timeout performed, IV checked, risks and benefits discussed and monitors and equipment checked  Epidural Patient position: sitting Prep: ChloraPrep Patient monitoring: heart rate, continuous pulse ox and blood pressure Approach: midline Location: L3-L4 Injection technique: LOR saline  Needle:  Needle type: Tuohy  Needle gauge: 17 G Needle length: 9 cm and 9 Needle insertion depth: 6 cm Catheter type: closed end flexible Catheter size: 19 Gauge Catheter at skin depth: 11 cm Test dose: negative and 1.5% lidocaine with Epi 1:200 K  Assessment Sensory level: T10 Events: blood not aspirated, injection not painful, no injection resistance, negative IV test and no paresthesia  Additional Notes 1st attempt Pt. Evaluated and documentation done after procedure finished. Patient identified. Risks/Benefits/Options discussed with patient including but not limited to bleeding, infection, nerve damage, paralysis, failed block, incomplete pain control, headache, blood pressure changes, nausea, vomiting, reactions to medication both or allergic, itching and postpartum back pain. Confirmed with bedside nurse the patient's most recent platelet count. Confirmed with patient that they are not currently taking any anticoagulation, have any bleeding history or any family history of bleeding disorders. Patient expressed understanding and wished to proceed. All questions were answered. Sterile technique was used throughout the entire procedure. Please see nursing notes for vital signs. Test dose was given through epidural catheter and negative prior to continuing to dose epidural or start infusion. Warning signs of high  block given to the patient including shortness of breath, tingling/numbness in hands, complete motor block, or any concerning symptoms with instructions to call for help. Patient was given instructions on fall risk and not to get out of bed. All questions and concerns addressed with instructions to call with any issues or inadequate analgesia.   Patient tolerated the insertion well without immediate complications.Reason for block:procedure for pain

## 2019-06-20 NOTE — Plan of Care (Signed)
Pt presents to L&D for IOL at midnight. Pt labors well and delivers a baby boy at 57. Pt recovers well in recovery and is transferred to mother baby for postpartum care. All questions answered and pt verbalized understanding.

## 2019-06-20 NOTE — H&P (Signed)
Obstetric History and Physical  Kallista Pae is a 28 y.o. G1P0000 with IUP at [redacted]w[redacted]d presenting with foley bulb and orders for IOL. Patient states she has been having  regular, every 3-4 minutes contractions, minimal vaginal bleeding, intact membranes, with active fetal movement.    Prenatal Course Source of Care: [facility]  Pregnancy complications or risks:none  Prenatal labs and studies: ABO, Rh: --/--/A POS (07/28 0865) Antibody: NEG (07/28 0219) Rubella: 6.23 (12/12 1017) RPR: Non Reactive (04/16 1118)  HBsAg: Negative (12/12 1017)  HIV: Non Reactive (12/12 1017)  HQI:ONGEXBMW (06/24 1012) 1 hr Glucola  normal Genetic screening normal Anatomy US normal  Past Medical History:  Diagnosis Date  . Hypothyroid   . Thyroid disease     Past Surgical History:  Procedure Laterality Date  . TONSILLECTOMY    . TONSILLECTOMY AND ADENOIDECTOMY      OB History  Gravida Para Term Preterm AB Living  1 0 0 0 0 0  SAB TAB Ectopic Multiple Live Births  0 0 0 0 0    # Outcome Date GA Lbr Len/2nd Weight Sex Delivery Anes PTL Lv  1 Current             Social History   Socioeconomic History  . Marital status: Married    Spouse name: Rodman Key   . Number of children: Not on file  . Years of education: Not on file  . Highest education level: Not on file  Occupational History  . Not on file  Social Needs  . Financial resource strain: Not hard at all  . Food insecurity    Worry: Never true    Inability: Never true  . Transportation needs    Medical: No    Non-medical: No  Tobacco Use  . Smoking status: Never Smoker  . Smokeless tobacco: Never Used  Substance and Sexual Activity  . Alcohol use: No    Frequency: Never  . Drug use: No  . Sexual activity: Yes    Comment: Natural family planning  Lifestyle  . Physical activity    Days per week: 3 days    Minutes per session: 30 min  . Stress: Rather much  Relationships  . Social connections    Talks on phone: More than three  times a week    Gets together: More than three times a week    Attends religious service: More than 4 times per year    Active member of club or organization: Yes    Attends meetings of clubs or organizations: More than 4 times per year    Relationship status: Married  Other Topics Concern  . Not on file  Social History Narrative  . Not on file    Family History  Problem Relation Age of Onset  . Diabetes Mother   . Thyroid disease Mother   . Diabetes Brother   . Thyroid disease Brother   . Diabetes Maternal Grandmother   . Thyroid disease Paternal Grandmother     Medications Prior to Admission  Medication Sig Dispense Refill Last Dose  . Iron-FA-B Cmp-C-Biot-Probiotic (FUSION PLUS) CAPS Take 1 tablet by mouth daily. 30 capsule 4 06/19/2019 at Unknown time  . Prenat-Fe Carbonyl-FA-Omega 3 (ONE-A-DAY WOMENS PRENATAL 1 PO)    06/19/2019 at Unknown time  . SYNTHROID 88 MCG tablet TAKE 1 TABLET BY MOUTH EVERY DAY 90 tablet 0 06/19/2019 at Unknown time  . butalbital-acetaminophen-caffeine (FIORICET) 50-325-40 MG tablet Take 1-2 tablets by mouth every 6 (six) hours as  needed for headache. (Patient not taking: Reported on 06/20/2019) 10 tablet 0 Not Taking at Unknown time  . hydrocortisone-pramoxine (PROCTOFOAM HC) rectal foam Place 1 applicator rectally 2 (two) times daily. (Patient not taking: Reported on 06/20/2019) 10 g 1 Not Taking at Unknown time    No Known Allergies  Review of Systems: Negative except for what is mentioned in HPI.  Physical Exam: BP 123/70 (BP Location: Left Arm)   Pulse 75   Temp 98.4 F (36.9 C) (Oral)   Resp 18   Ht 5\' 6"  (1.676 m)   Wt 106.1 kg   LMP 08/23/2018 (Exact Date)   BMI 37.77 kg/m  GENERAL: Well-developed, well-nourished female in no acute distress.  LUNGS: Clear to auscultation bilaterally.  HEART: Regular rate and rhythm. ABDOMEN: Soft, nontender, nondistended, gravid. EXTREMITIES: Nontender, no edema, 2+ distal pulses. Cervical Exam:  Dilation: 6 Effacement (%): 80, 90 Cervical Position: Middle Station: -3 Exam by:: Charlane FerrettiP. Funk RN FHT:  Baseline rate 128 bpm   Variability moderate  Accelerations present   Decelerations none Contractions: Every 1-3 mins moderate to palpation   Pertinent Labs/Studies:   Results for orders placed or performed during the hospital encounter of 06/19/19 (from the past 24 hour(s))  CBC     Status: Abnormal   Collection Time: 06/20/19 12:54 AM  Result Value Ref Range   WBC 12.9 (H) 4.0 - 10.5 K/uL   RBC 4.24 3.87 - 5.11 MIL/uL   Hemoglobin 12.9 12.0 - 15.0 g/dL   HCT 16.138.7 09.636.0 - 04.546.0 %   MCV 91.3 80.0 - 100.0 fL   MCH 30.4 26.0 - 34.0 pg   MCHC 33.3 30.0 - 36.0 g/dL   RDW 40.914.8 81.111.5 - 91.415.5 %   Platelets 172 150 - 400 K/uL   nRBC 0.0 0.0 - 0.2 %  Comprehensive metabolic panel     Status: Abnormal   Collection Time: 06/20/19 12:54 AM  Result Value Ref Range   Sodium 135 135 - 145 mmol/L   Potassium 4.2 3.5 - 5.1 mmol/L   Chloride 106 98 - 111 mmol/L   CO2 20 (L) 22 - 32 mmol/L   Glucose, Bld 96 70 - 99 mg/dL   BUN 12 6 - 20 mg/dL   Creatinine, Ser 7.820.59 0.44 - 1.00 mg/dL   Calcium 9.4 8.9 - 95.610.3 mg/dL   Total Protein 6.6 6.5 - 8.1 g/dL   Albumin 3.0 (L) 3.5 - 5.0 g/dL   AST 26 15 - 41 U/L   ALT 12 0 - 44 U/L   Alkaline Phosphatase 202 (H) 38 - 126 U/L   Total Bilirubin 0.5 0.3 - 1.2 mg/dL   GFR calc non Af Amer >60 >60 mL/min   GFR calc Af Amer >60 >60 mL/min   Anion gap 9 5 - 15  Type and screen     Status: None (Preliminary result)   Collection Time: 06/20/19 12:54 AM  Result Value Ref Range   ABO/RH(D) PENDING    Antibody Screen PENDING    Sample Expiration      06/23/2019,2359 Performed at [facility], [address] [place], [place] [address]   Type and screen     Status: None   Collection Time: 06/20/19  2:19 AM  Result Value Ref Range   ABO/RH(D) A POS    Antibody Screen NEG    Sample Expiration      06/23/2019,2359 Performed at [facility],  [address] [place], [place] [address]  Assessment : Nolene EbbsCaitlin Anacker is a 28 y.o. G1P0000 at 3563w0d being admitted for labor.  Plan: Labor: Expectant management.  Induction/Augmentation as needed, per protocol FWB: Reassuring fetal heart tracing.  GBS negative Delivery plan: Hopeful for vaginal delivery  Marwin Primmer, CNM [facility]

## 2019-06-20 NOTE — Anesthesia Preprocedure Evaluation (Signed)
Anesthesia Evaluation  Patient identified by MRN, date of birth, ID band Patient awake    Reviewed: Allergy & Precautions, H&P , NPO status , Patient's Chart, lab work & pertinent test results, reviewed documented beta blocker date and time   History of Anesthesia Complications Negative for: history of anesthetic complications  Airway Mallampati: I  TM Distance: >3 FB Neck ROM: full    Dental no notable dental hx.    Pulmonary neg pulmonary ROS,    Pulmonary exam normal        Cardiovascular Exercise Tolerance: Good negative cardio ROS Normal cardiovascular exam     Neuro/Psych negative neurological ROS  negative psych ROS   GI/Hepatic negative GI ROS, Neg liver ROS,   Endo/Other  neg diabetesHypothyroidism   Renal/GU negative Renal ROS  negative genitourinary   Musculoskeletal   Abdominal   Peds  Hematology negative hematology ROS (+)   Anesthesia Other Findings Past Medical History: No date: Hypothyroid No date: Thyroid disease   Reproductive/Obstetrics (+) Pregnancy                             Anesthesia Physical Anesthesia Plan  ASA: II  Anesthesia Plan: Epidural   Post-op Pain Management:    Induction:   PONV Risk Score and Plan:   Airway Management Planned:   Additional Equipment:   Intra-op Plan:   Post-operative Plan:   Informed Consent: I have reviewed the patients History and Physical, chart, labs and discussed the procedure including the risks, benefits and alternatives for the proposed anesthesia with the patient or authorized representative who has indicated his/her understanding and acceptance.     Dental Advisory Given  Plan Discussed with: Anesthesiologist, CRNA and Surgeon  Anesthesia Plan Comments:         Anesthesia Quick Evaluation

## 2019-06-21 LAB — CBC
HCT: 29.7 % — ABNORMAL LOW (ref 36.0–46.0)
Hemoglobin: 9.8 g/dL — ABNORMAL LOW (ref 12.0–15.0)
MCH: 30.3 pg (ref 26.0–34.0)
MCHC: 33 g/dL (ref 30.0–36.0)
MCV: 92 fL (ref 80.0–100.0)
Platelets: 123 10*3/uL — ABNORMAL LOW (ref 150–400)
RBC: 3.23 MIL/uL — ABNORMAL LOW (ref 3.87–5.11)
RDW: 14.7 % (ref 11.5–15.5)
WBC: 16.8 10*3/uL — ABNORMAL HIGH (ref 4.0–10.5)
nRBC: 0 % (ref 0.0–0.2)

## 2019-06-21 LAB — RPR: RPR Ser Ql: NONREACTIVE

## 2019-06-21 NOTE — Lactation Note (Signed)
This note was copied from a baby's chart. Lactation Consultation Note  Patient Name: Julie Rowe OACZY'S Date: 06/21/2019    Spoke with mom this morning. Baby Julie Rowe just returned from his circumcision. Mom reports breastfeeding over night seemed to go well. Mom was confident in placing baby at breast and achieving a deep latch. Mom reports some spitting a few times over night. Longest feed was earlier this morning for 20 mins.  Reviewed early hunger cues, newborn feeding patterns, newborn stomach size, and wet/stool diapers.   Lactation name and number written on white board, encouraged to call out with any questions/concerns.   Maternal Data Has patient been taught Hand Expression?: Yes  Feeding Feeding Type: Breast Fed  [facility] Score                   Interventions    Lactation Tools Discussed/Used     Consult Status      Julie Rowe 06/21/2019, 9:47 AM

## 2019-06-21 NOTE — Anesthesia Postprocedure Evaluation (Signed)
Anesthesia Post Note  Patient: Julie Rowe  Procedure(s) Performed: AN AD [place]  Patient location during evaluation: Mother Baby Anesthesia Type: Epidural Level of consciousness: awake and alert Pain management: pain level controlled Vital Signs Assessment: post-procedure vital signs reviewed and stable Respiratory status: spontaneous breathing, nonlabored ventilation and respiratory function stable Cardiovascular status: stable Postop Assessment: no headache, no backache and epidural receding Anesthetic complications: no     Last Vitals:  Vitals:   06/21/19 0303 06/21/19 0730  BP: 112/78 124/77  Pulse: 86 84  Resp: 18 18  Temp:  36.6 C  SpO2: 97% 99%    Last Pain:  Vitals:   06/21/19 1010  TempSrc:   PainSc: 0-No pain                 He[facility] Meeker Lorenza Chick

## 2019-06-21 NOTE — Progress Notes (Signed)
Progress Note - Vaginal Delivery  Julie Rowe is a 28 y.o. G1P1001 now PP day 1 s/p Vaginal, Spontaneous .   Subjective:  The patient reports no complaints, up ad lib, voiding, tolerating PO and + flatus   Objective:  Vital signs in last 24 hours: Temp:  [97.6 F (36.4 C)-98.5 F (36.9 C)] 97.8 F (36.6 C) (07/29 0730) Pulse Rate:  [70-110] 84 (07/29 0730) Resp:  [16-18] 18 (07/29 0730) BP: (96-139)/(54-89) 124/77 (07/29 0730) SpO2:  [95 %-100 %] 99 % (07/29 0730)  Physical Exam:  General: alert, cooperative, appears stated age and fatigued Lochia: appropriate Uterine Fundus: firm DVT Evaluation: No evidence of DVT seen on physical exam. No cords or calf tenderness. No significant calf/ankle edema.    Data Review Recent Labs    06/20/19 0054 06/21/19 0519  HGB 12.9 9.8*  HCT 38.7 29.7*    Assessment/Plan: Active Problems:   Encounter for planned induction of labor   Plan for discharge tomorrow   -- Continue routine PP care.     Philip Aspen, CNM  06/21/2019 7:54 AM

## 2019-06-22 NOTE — Lactation Note (Signed)
Lactation Consultation Note  Patient Name: Mitzie Marlar GQQPY'[id number] Date: 06/22/2019 Reason for consult: Initial assessment   Maternal Data   First time mother. Feeding  baby had cluster fed in the night.  LATCH Score  baby was not breast feeding .                  Interventions   Encouraged mother to reach out to me if she needed help. Lactation Tools Discussed/Used     Consult Status      Daryel November 06/22/2019, 1:36 PM

## 2019-06-22 NOTE — Progress Notes (Signed)
Discharge instructions, prescriptions, education, and appointments given and explained. Pt verbalized understanding with no further questions. Pt to call out when ready for wheelchairs

## 2019-06-22 NOTE — Discharge Summary (Signed)
                            Discharge Summary  Date of Admission: 06/19/2019  Date of Discharge: 06/22/2019  Admitting Diagnosis: Induction of labor at [redacted]w[redacted]d  Mode of Delivery: normal spontaneous vaginal delivery                 Discharge Diagnosis: No other diagnosis   Intrapartum Procedures: epidural   Post partum procedures: none  Complications: 2nd degree perineal laceration                      Discharge Day SOAP Note:  Progress Note - Vaginal Delivery  Julie Rowe is a 28 y.o. G1P1001 now PP day 2 s/p Vaginal, Spontaneous . Delivery was uncomplicated  Subjective  The patient has the following complaints: has no unusual complaints  Pain is controlled with current medications.   Patient is urinating without difficulty.  She is ambulating well.    Objective  Vital signs: BP 123/75 (BP Location: Right Arm)   Pulse 79   Temp 98.4 F (36.9 C)   Resp 20   Ht 5\' 6"  (1.676 m)   Wt 106.1 kg   LMP 08/23/2018 (Exact Date)   SpO2 98%   Breastfeeding Unknown   BMI 37.77 kg/m   Physical Exam: Gen: NAD Fundus Fundal Tone: Firm @u -1  Lochia Amount: Small  Perineum Appearance: Intact     Data Review Labs: CBC Latest Ref Rng & Units 06/21/2019 06/20/2019 06/10/2019  WBC 4.0 - 10.5 K/uL 16.8(H) 12.9(H) 12.3(H)  Hemoglobin 12.0 - 15.0 g/dL 9.8(L) 12.9 10.4(L)  Hematocrit 36.0 - 46.0 % 29.7(L) 38.7 31.4(L)  Platelets 150 - 400 K/uL 123(L) 172 158   A POS  Assessment/Plan  Active Problems:   Encounter for planned induction of labor    Plan for discharge today.   Discharge Instructions: Per After Visit Summary. Activity: Advance as tolerated. Pelvic rest for 6 weeks.  Also refer to After Visit Summary Diet: Regular Medications: Allergies as of 06/22/2019   No Known Allergies     Medication List    STOP taking these medications   butalbital-acetaminophen-caffeine 50-325-40 MG tablet Commonly known as: FIORICET     TAKE these medications   Fusion Plus  Caps Take 1 tablet by mouth daily.   hydrocortisone-pramoxine rectal foam Commonly known as: Proctofoam HC Place 1 applicator rectally 2 (two) times daily.   ONE-A-DAY WOMENS PRENATAL 1 PO   Synthroid 88 MCG tablet Generic drug: levothyroxine TAKE 1 TABLET BY MOUTH EVERY DAY      Outpatient follow up: Melody Shambley CNM  Postpartum contraception: Condoms , natural family planning  Discharged Condition: good  Discharged to: home  Newborn Data: Disposition:home with mother  Apgars: APGAR (1 MIN): 7   APGAR (5 MINS): 8   APGAR (10 MINS):    Baby Feeding: Breast    Philip Aspen, CNM  06/22/2019 12:43 PM

## 2019-06-22 NOTE — Progress Notes (Signed)
Pt wheeled down to personal vehicle by staff accompanied by husband and infant. Pt belongings wheeled in cart to personal vehicle.

## 2019-06-22 NOTE — Final Progress Note (Signed)
Discharge Day SOAP Note:  Progress Note - Vaginal Delivery  Julie Rowe is a 28 y.o. G1P1001 now PP day 2 s/p Vaginal, Spontaneous . Delivery was uncomplicated  Subjective  The patient has the following complaints: has no unusual complaints  Pain is controlled with current medications.   Patient is urinating without difficulty.  She is ambulating well.    Objective  Vital signs: BP 123/75 (BP Location: Right Arm)   Pulse 79   Temp 98.4 F (36.9 C)   Resp 20   Ht 5\' 6"  (1.676 m)   Wt 106.1 kg   LMP 08/23/2018 (Exact Date)   SpO2 98%   Breastfeeding Unknown   BMI 37.77 kg/m   Physical Exam: Gen: NAD Fundus Fundal Tone: Firm @u -1  Lochia Amount: Small  Perineum Appearance: Intact     Data Review Labs: CBC Latest Ref Rng & Units 06/21/2019 06/20/2019 06/10/2019  WBC 4.0 - 10.5 K/uL 16.8(H) 12.9(H) 12.3(H)  Hemoglobin 12.0 - 15.0 g/dL 9.8(L) 12.9 10.4(L)  Hematocrit 36.0 - 46.0 % 29.7(L) 38.7 31.4(L)  Platelets 150 - 400 K/uL 123(L) 172 158   A POS  Assessment/Plan  Active Problems:   Encounter for planned induction of labor    Plan for discharge today.   Discharge Instructions: Per After Visit Summary. Activity: Advance as tolerated. Pelvic rest for 6 weeks.  Also refer to After Visit Summary Diet: Regular Medications: Allergies as of 06/22/2019   No Known Allergies     Medication List    STOP taking these medications   butalbital-acetaminophen-caffeine 50-325-40 MG tablet Commonly known as: FIORICET     TAKE these medications   Fusion Plus Caps Take 1 tablet by mouth daily.   hydrocortisone-pramoxine rectal foam Commonly known as: Proctofoam HC Place 1 applicator rectally 2 (two) times daily.   ONE-A-DAY WOMENS PRENATAL 1 PO   Synthroid 88 MCG tablet Generic drug: levothyroxine TAKE 1 TABLET BY MOUTH EVERY DAY      Outpatient follow up: Melody Shambley CNM  Postpartum contraception: Condoms , natural family planning  Discharged  Condition: good  Discharged to: home  Newborn Data: Disposition:home with mother  Apgars: APGAR (1 MIN): 7   APGAR (5 MINS): 8   APGAR (10 MINS):    Baby Feeding: Breast    Philip Aspen, CNM  06/22/2019

## 2019-08-08 ENCOUNTER — Encounter: Payer: Self-pay | Admitting: Obstetrics and Gynecology

## 2019-08-08 ENCOUNTER — Other Ambulatory Visit: Payer: Self-pay

## 2019-08-08 ENCOUNTER — Ambulatory Visit (INDEPENDENT_AMBULATORY_CARE_PROVIDER_SITE_OTHER): Payer: 59 | Admitting: Obstetrics and Gynecology

## 2019-08-08 DIAGNOSIS — Z6833 Body mass index (BMI) 33.0-33.9, adult: Secondary | ICD-10-CM

## 2019-08-08 DIAGNOSIS — E039 Hypothyroidism, unspecified: Secondary | ICD-10-CM

## 2019-08-08 NOTE — Progress Notes (Signed)
  Subjective:     Julie Rowe is a 28 y.o. female who presents for a postpartum visit. She is 7 weeks postpartum following a spontaneous vaginal delivery. I have fully reviewed the prenatal and intrapartum course. The delivery was at 61 gestational weeks. Outcome: spontaneous vaginal delivery. Anesthesia: epidural. Postpartum course has been uncomplicated and patient is down 28 #s. Baby's course has been uncomplicated. Baby is feeding by breast. Bleeding no bleeding. Bowel function is normal. Bladder function is normal. Patient is not sexually active. Contraception method is abstinence. Postpartum depression screening: negative.  The following portions of the patient's history were reviewed and updated as appropriate: allergies, current medications, past family history, past medical history, past social history, past surgical history and problem list.  Review of Systems A comprehensive review of systems was negative.  Depression screen [facility] 2/9 08/08/2019  Decreased Interest 0  Down, Depressed, Hopeless 0  PHQ - 2 Score 0   Objective:    BP 128/76   Pulse 88   Ht 5\' 6"  (1.676 m)   Wt 208 lb (94.3 kg)   Breastfeeding Yes   BMI 33.57 kg/m   General:  alert, cooperative, appears stated age and morbidly obese   Breasts:  inspection negative, no nipple discharge or bleeding, no masses or nodularity palpable  Lungs: clear to auscultation bilaterally  Heart:  regular rate and rhythm, S1, S2 normal, no murmur, click, rub or gallop  Abdomen: soft, non-tender; bowel sounds normal; no masses,  no organomegaly   Vulva:  normal  Vagina: normal vagina  Cervix:  multiparous appearance  Corpus: normal size, contour, position, consistency, mobility, non-tender  Adnexa:  no mass, fullness, tenderness  Rectal Exam: Normal rectovaginal exam        Assessment:     7 weeks  postpartum exam. Pap smear not done at today's visit.   Plan:    1. Contraception: condoms 2. Labs obtained- will follow up  accordingly, will get flu vaccine at work 3. Follow up in: 3 months or as needed.

## 2019-08-08 NOTE — Patient Instructions (Signed)
  Place postpartum visit patient instructions here.  

## 2019-08-09 LAB — THYROID PANEL WITH TSH
Free Thyroxine Index: 3.3 (ref 1.2–4.9)
T3 Uptake Ratio: 32 % (ref 24–39)
T4, Total: 10.2 ug/dL (ref 4.5–12.0)
TSH: 0.013 u[IU]/mL — ABNORMAL LOW (ref 0.450–4.500)

## 2019-08-09 LAB — VITAMIN D 25 HYDROXY (VIT D DEFICIENCY, FRACTURES): Vit D, 25-Hydroxy: 30.1 ng/mL (ref 30.0–100.0)

## 2019-08-09 LAB — CBC
Hematocrit: 41.6 % (ref 34.0–46.6)
Hemoglobin: 13.8 g/dL (ref 11.1–15.9)
MCH: 30.1 pg (ref 26.6–33.0)
MCHC: 33.2 g/dL (ref 31.5–35.7)
MCV: 91 fL (ref 79–97)
Platelets: 222 10*3/uL (ref 150–450)
RBC: 4.59 x10E6/uL (ref 3.77–5.28)
RDW: 13.5 % (ref 11.7–15.4)
WBC: 6 10*3/uL (ref 3.4–10.8)

## 2019-08-09 LAB — FERRITIN: Ferritin: 46 ng/mL (ref 15–150)

## 2019-08-15 ENCOUNTER — Other Ambulatory Visit: Payer: Self-pay

## 2019-08-15 ENCOUNTER — Encounter: Payer: Self-pay | Admitting: Certified Nurse Midwife

## 2019-08-15 ENCOUNTER — Ambulatory Visit (INDEPENDENT_AMBULATORY_CARE_PROVIDER_SITE_OTHER): Payer: 59 | Admitting: Certified Nurse Midwife

## 2019-08-15 VITALS — BP 133/78 | HR 75 | Ht 66.0 in | Wt 206.1 lb

## 2019-08-15 DIAGNOSIS — K649 Unspecified hemorrhoids: Secondary | ICD-10-CM

## 2019-08-15 MED ORDER — DOCUSATE SODIUM 100 MG PO CAPS: 100.0000 mg | ORAL_CAPSULE | Freq: Two times a day (BID) | ORAL | 0 refills | Status: DC

## 2019-08-15 MED ORDER — PROCTOFOAM HC 1-1 % EX FOAM: 1.0000 | Freq: Two times a day (BID) | CUTANEOUS | 1 refills | Status: DC

## 2019-08-15 NOTE — Patient Instructions (Signed)

## 2019-08-15 NOTE — Progress Notes (Signed)
GYN ENCOUNTER NOTE  Subjective:       Julie Rowe is a 28 y.o. G40P1001 female is here for gynecologic evaluation of the following issues:  1. Constipation and hemorrhoid pain. She states that on Friday she had a BM that she had to really strain to pass. She experienced blood with passing and has continues to pain and bleeding when having bowel movements.    Gynecologic History No LMP recorded. Contraception: condoms Last Pap: 12/31/17. Results were: normal Last mammogram: n/a.   Obstetric History OB History  Gravida Para Term Preterm AB Living  1 1 1  0 0 1  SAB TAB Ectopic Multiple Live Births  0 0 0 0 1    # Outcome Date GA Lbr Len/2nd Weight Sex Delivery Anes PTL Lv  1 Term 06/20/19 [redacted]w[redacted]d 11:17 / 03:13 8 lb 8.9 oz (3.88 kg) M Vag-Spont EPI  LIV    Past Medical History:  Diagnosis Date  . Hypothyroid   . Thyroid disease     Past Surgical History:  Procedure Laterality Date  . TONSILLECTOMY    . TONSILLECTOMY AND ADENOIDECTOMY      Current Outpatient Medications on File Prior to Visit  Medication Sig Dispense Refill  . Prenat-Fe Carbonyl-FA-Omega 3 (ONE-A-DAY WOMENS PRENATAL 1 PO)     . SYNTHROID 88 MCG tablet TAKE 1 TABLET BY MOUTH EVERY DAY 90 tablet 0  . hydrocortisone-pramoxine (PROCTOFOAM HC) rectal foam Place 1 applicator rectally 2 (two) times daily. (Patient not taking: Reported on 06/20/2019) 10 g 1  . Iron-FA-B Cmp-C-Biot-Probiotic (FUSION PLUS) CAPS Take 1 tablet by mouth daily. (Patient not taking: Reported on 08/08/2019) 30 capsule 4   No current facility-administered medications on file prior to visit.     No Known Allergies  Social History   Socioeconomic History  . Marital status: Married    Spouse name: Rodman Key   . Number of children: Not on file  . Years of education: Not on file  . Highest education level: Not on file  Occupational History  . Not on file  Social Needs  . Financial resource strain: Not hard at all  . Food insecurity   Worry: Never true    Inability: Never true  . Transportation needs    Medical: No    Non-medical: No  Tobacco Use  . Smoking status: Never Smoker  . Smokeless tobacco: Never Used  Substance and Sexual Activity  . Alcohol use: No    Frequency: Never  . Drug use: No  . Sexual activity: Not Currently    Comment: Natural family planning  Lifestyle  . Physical activity    Days per week: 3 days    Minutes per session: 30 min  . Stress: Rather much  Relationships  . Social connections    Talks on phone: More than three times a week    Gets together: More than three times a week    Attends religious service: More than 4 times per year    Active member of club or organization: Yes    Attends meetings of clubs or organizations: More than 4 times per year    Relationship status: Married  . Intimate partner violence    Fear of current or ex partner: No    Emotionally abused: No    Physically abused: No    Forced sexual activity: No  Other Topics Concern  . Not on file  Social History Narrative  . Not on file    Family History  Problem  Relation Age of Onset  . Diabetes Mother   . Thyroid disease Mother   . Diabetes Brother   . Thyroid disease Brother   . Diabetes Maternal Grandmother   . Thyroid disease Paternal Grandmother     The following portions of the patient's history were reviewed and updated as appropriate: allergies, current medications, past family history, past medical history, past social history, past surgical history and problem list.  Review of Systems Review of Systems - Negative except as mentioned in hpi Review of Systems - General ROS: negative for - chills, fatigue, fever, hot flashes, malaise or night sweats Hematological and Lymphatic ROS: negative for - bleeding problems or swollen lymph nodes Gastrointestinal ROS: negative for - abdominal pain, blood in stools, change in bowel habits and nausea/vomiting Musculoskeletal ROS: negative for - joint pain,  muscle pain or muscular weakness Genito-Urinary ROS: negative for - change in menstrual cycle, dysmenorrhea, dyspareunia, dysuria, genital discharge, genital ulcers, hematuria, incontinence, irregular/heavy menses, nocturia or pelvic pain. Positive for hemorrhoids    Objective:   BP 133/78   Pulse 75   Ht 5\' 6"  (1.676 m)   Wt 206 lb 2 oz (93.5 kg)   Breastfeeding Yes   BMI 33.27 kg/m  CONSTITUTIONAL: Well-developed, well-nourished female in no acute distress.  HENT:  Normocephalic, atraumatic.  NECK: Normal range of motion, supple, no masses.  Normal thyroid.  SKIN: Skin is warm and dry. No rash noted. Not diaphoretic. No erythema. No pallor. NEUROLGIC: Alert and oriented to person, place, and time. PSYCHIATRIC: Normal mood and affect. Normal behavior. Normal judgment and thought content. CARDIOVASCULAR:Not Examined RESPIRATORY: Not Examined BREASTS: Not Examined ABDOMEN: Soft, non distended; Non tender.  No Organomegaly. PELVIC: Rectal: 2 small , not inflamed hemorrhoids.  MUSCULOSKELETAL: Normal range of motion. No tenderness.  No cyanosis, clubbing, or edema.   Assessment:   Hemorrhoids   Plan:   Proctofoam ordered, discussed sitz bath prn, encouraged use of colace BID as needed and encouraged increase in PO hydration, increase in fiber in diet. She verbalizes and agrees to plan. Follow up PRN.   , CNM

## 2019-08-24 ENCOUNTER — Other Ambulatory Visit: Payer: Self-pay | Admitting: Certified Nurse Midwife

## 2019-08-29 ENCOUNTER — Encounter: Payer: 59 | Admitting: Obstetrics and Gynecology

## 2019-11-07 ENCOUNTER — Encounter: Payer: 59 | Admitting: Obstetrics and Gynecology

## 2019-11-08 ENCOUNTER — Encounter: Payer: 59 | Admitting: Certified Nurse Midwife

## 2019-11-26 ENCOUNTER — Other Ambulatory Visit: Payer: Self-pay | Admitting: Certified Nurse Midwife

## 2019-11-26 IMAGING — US ULTRASOUND FETAL BPP WITH NON-STRESS
1 series · 10 of 10 positions shown · non-contrast
Comparison: none

CLINICAL DATA: Post dates.

EXAM:
LIMITED OBSTETRIC ULTRASOUND AND BIOPHYSICAL PROFILE

[Series 1: ultrasound fetal bpp with non-stress · 0.23mm/px · 10 acquisitions, 10 frames shown]
[im 1/10]
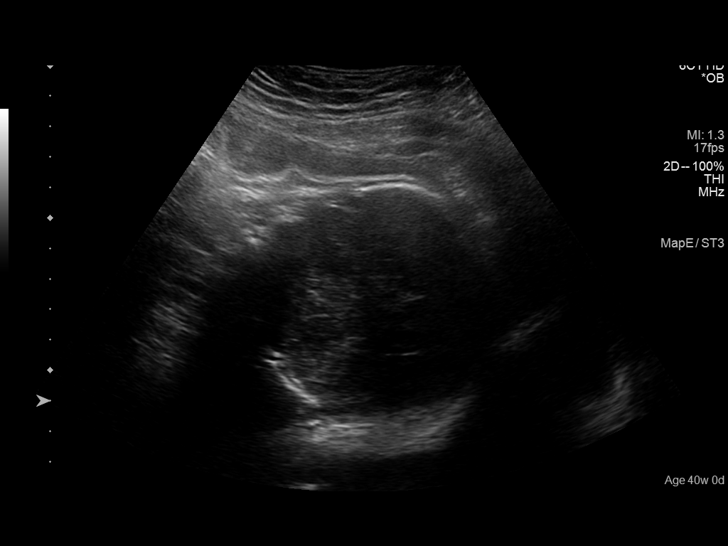
[im 2/10]
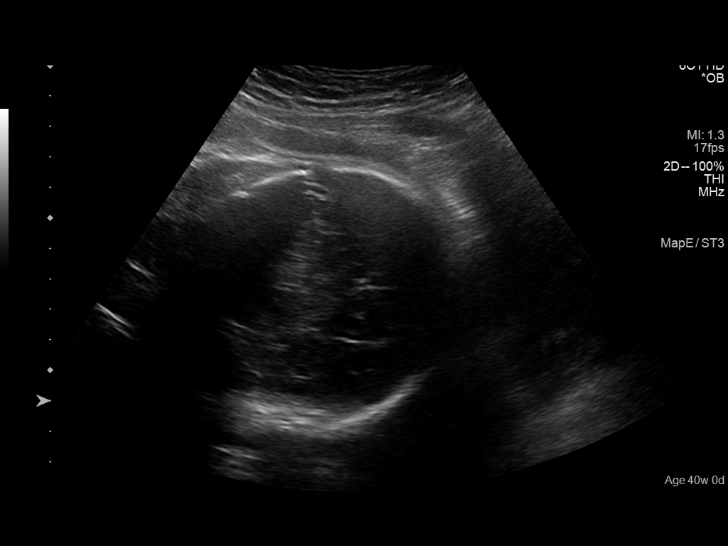
[im 3/10]
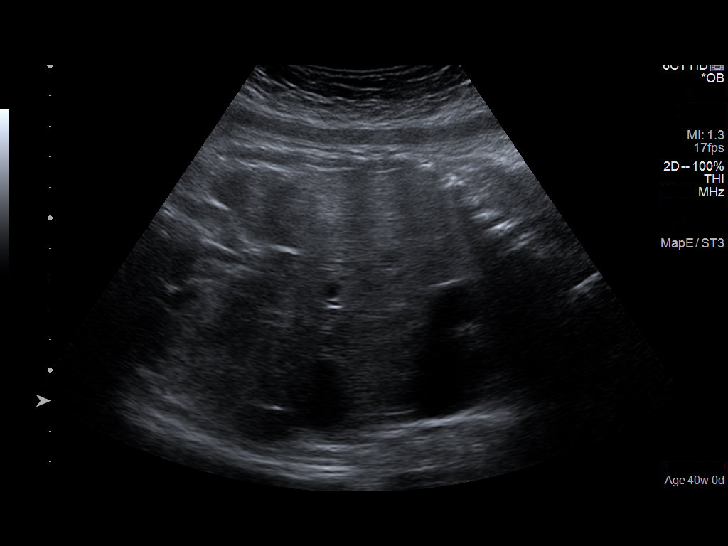
[im 4/10]
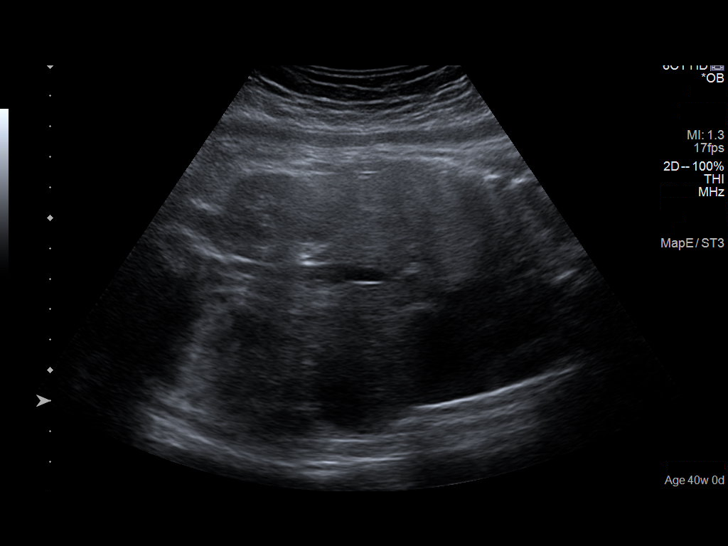
[im 5/10]
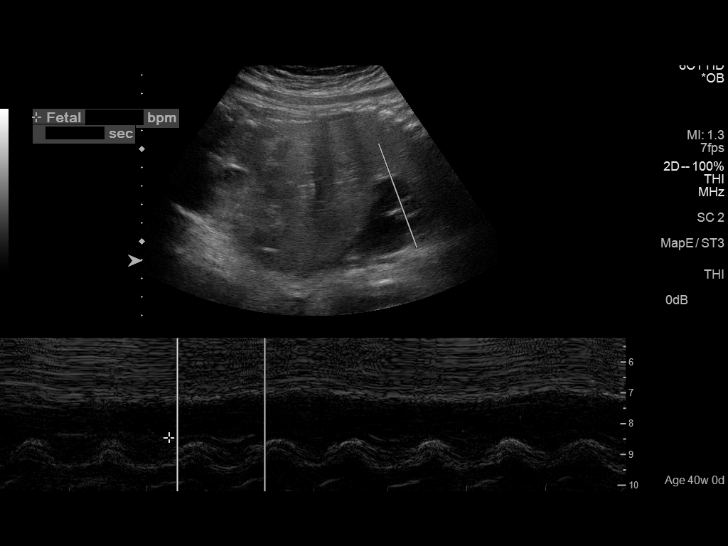
[im 6/10]
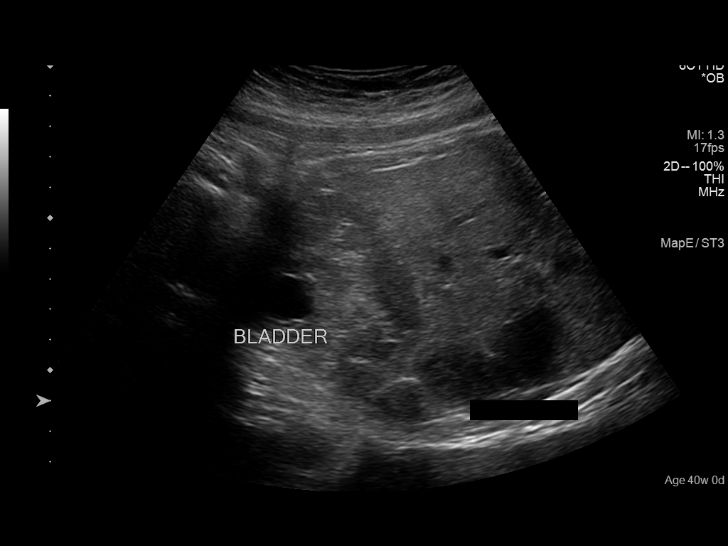
[im 7/10]
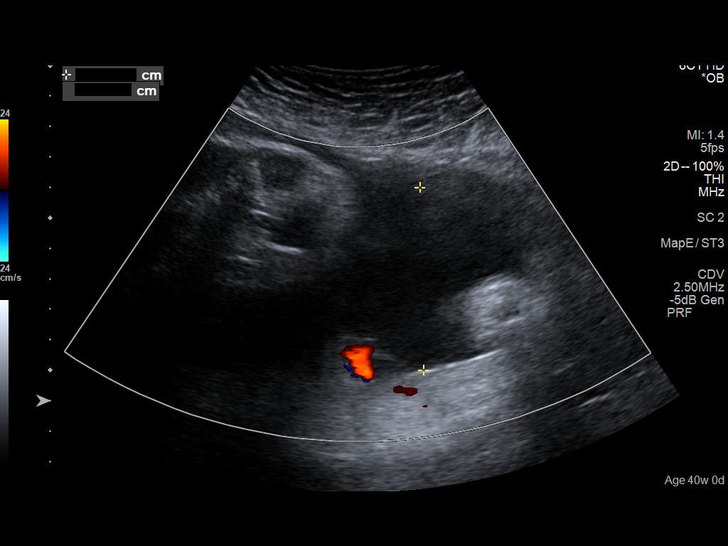
[im 8/10]
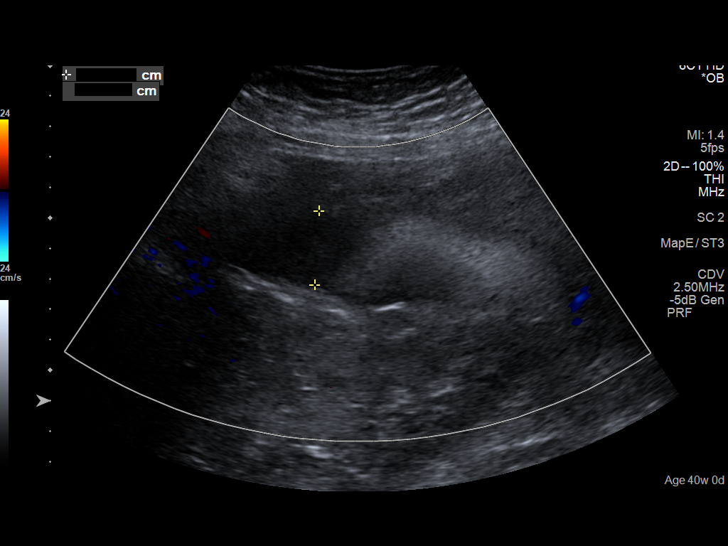
[im 9/10]
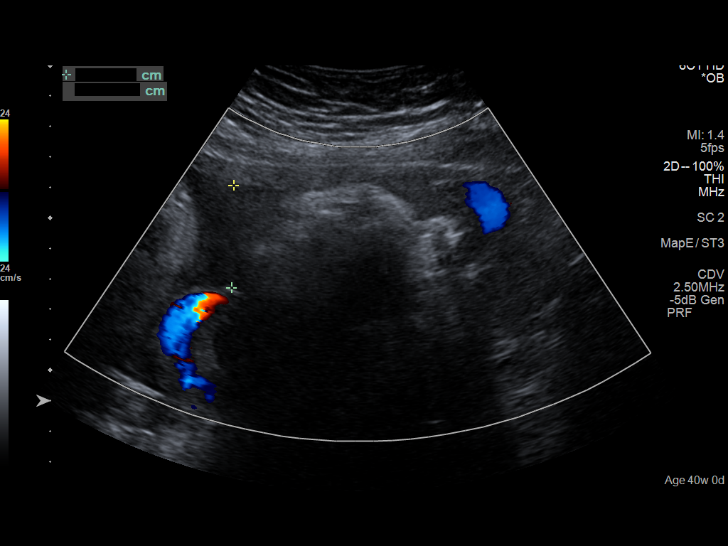
[im 10/10]
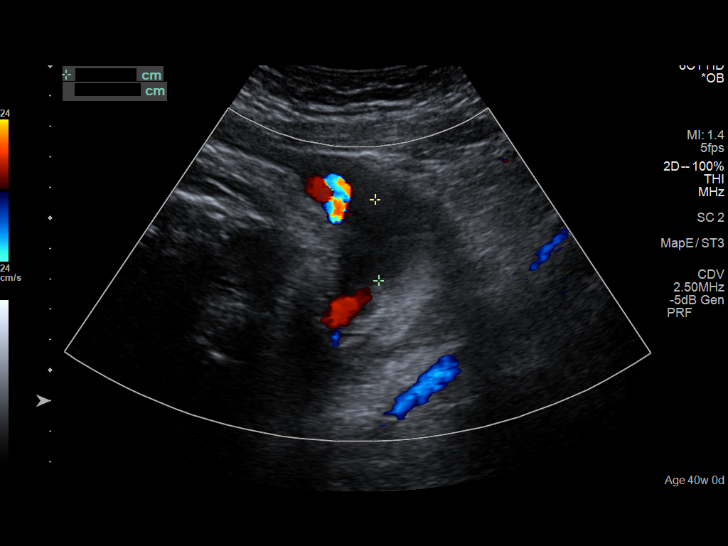

[10 of 10 positions shown; findings below may reference images not displayed]

FINDINGS: Number of Fetuses: 1

Heart Rate:  117 bpm

Movement: Present

Presentation: Cephalic

Placenta poorly visualized.

AFI: 14.5 cm

Movement:  2  Time: 15 minutes

Breathing: 2

Tone:  2

Amniotic Fluid: 2

Total Score:  8
IMPRESSION: Single viable intrauterine pregnancy in cephalic presentation. Fetal
heart rate 117 beats per minute.

Biophysical profile score is [DATE].

This study was limited due to gestational age.

## 2019-12-26 ENCOUNTER — Encounter: Payer: Self-pay | Admitting: Certified Nurse Midwife

## 2019-12-26 ENCOUNTER — Ambulatory Visit (INDEPENDENT_AMBULATORY_CARE_PROVIDER_SITE_OTHER): Payer: Managed Care, Other (non HMO) | Admitting: Certified Nurse Midwife

## 2019-12-26 ENCOUNTER — Other Ambulatory Visit: Payer: Self-pay

## 2019-12-26 VITALS — BP 116/77 | HR 65 | Ht 66.0 in | Wt 202.6 lb

## 2019-12-26 DIAGNOSIS — Z01419 Encounter for gynecological examination (general) (routine) without abnormal findings: Secondary | ICD-10-CM

## 2019-12-26 DIAGNOSIS — E039 Hypothyroidism, unspecified: Secondary | ICD-10-CM | POA: Diagnosis not present

## 2019-12-26 NOTE — Patient Instructions (Signed)
Preventive Care 21-29 Years Old, Female Preventive care refers to visits with your health care provider and lifestyle choices that can promote health and wellness. This includes:  A yearly physical exam. This may also be called an annual well check.  Regular dental visits and eye exams.  Immunizations.  Screening for certain conditions.  Healthy lifestyle choices, such as eating a healthy diet, getting regular exercise, not using drugs or products that contain nicotine and tobacco, and limiting alcohol use. What can I expect for my preventive care visit? Physical exam Your health care provider will check your:  Height and weight. This may be used to calculate body mass index (BMI), which tells if you are at a healthy weight.  Heart rate and blood pressure.  Skin for abnormal spots. Counseling Your health care provider may ask you questions about your:  Alcohol, tobacco, and drug use.  Emotional well-being.  Home and relationship well-being.  Sexual activity.  Eating habits.  Work and work environment.  Method of birth control.  Menstrual cycle.  Pregnancy history. What immunizations do I need?  Influenza (flu) vaccine  This is recommended every year. Tetanus, diphtheria, and pertussis (Tdap) vaccine  You may need a Td booster every 10 years. Varicella (chickenpox) vaccine  You may need this if you have not been vaccinated. Human papillomavirus (HPV) vaccine  If recommended by your health care provider, you may need three doses over 6 months. Measles, mumps, and rubella (MMR) vaccine  You may need at least one dose of MMR. You may also need a second dose. Meningococcal conjugate (MenACWY) vaccine  One dose is recommended if you are age 19-21 years and a first-year college student living in a residence hall, or if you have one of several medical conditions. You may also need additional booster doses. Pneumococcal conjugate (PCV13) vaccine  You may need  this if you have certain conditions and were not previously vaccinated. Pneumococcal polysaccharide (PPSV23) vaccine  You may need one or two doses if you smoke cigarettes or if you have certain conditions. Hepatitis A vaccine  You may need this if you have certain conditions or if you travel or work in places where you may be exposed to hepatitis A. Hepatitis B vaccine  You may need this if you have certain conditions or if you travel or work in places where you may be exposed to hepatitis B. Haemophilus influenzae type b (Hib) vaccine  You may need this if you have certain conditions. You may receive vaccines as individual doses or as more than one vaccine together in one shot (combination vaccines). Talk with your health care provider about the risks and benefits of combination vaccines. What tests do I need?  Blood tests  Lipid and cholesterol levels. These may be checked every 5 years starting at age 20.  Hepatitis C test.  Hepatitis B test. Screening  Diabetes screening. This is done by checking your blood sugar (glucose) after you have not eaten for a while (fasting).  Sexually transmitted disease (STD) testing.  BRCA-related cancer screening. This may be done if you have a family history of breast, ovarian, tubal, or peritoneal cancers.  Pelvic exam and Pap test. This may be done every 3 years starting at age 21. Starting at age 30, this may be done every 5 years if you have a Pap test in combination with an HPV test. Talk with your health care provider about your test results, treatment options, and if necessary, the need for more tests.   Follow these instructions at home: Eating and drinking   Eat a diet that includes fresh fruits and vegetables, whole grains, lean protein, and low-fat dairy.  Take vitamin and mineral supplements as recommended by your health care provider.  Do not drink alcohol if: ? Your health care provider tells you not to drink. ? You are  pregnant, may be pregnant, or are planning to become pregnant.  If you drink alcohol: ? Limit how much you have to 0-1 drink a day. ? Be aware of how much alcohol is in your drink. In the [place] one drink equals one 12 oz bottle of beer (355 mL), one 5 oz glass of wine (148 mL), or one 1 oz glass of hard liquor (44 mL). Lifestyle  Take daily care of your teeth and gums.  Stay active. Exercise for at least 30 minutes on 5 or more days each week.  Do not use any products that contain nicotine or tobacco, such as cigarettes, e-cigarettes, and chewing tobacco. If you need help quitting, ask your health care provider.  If you are sexually active, practice safe sex. Use a condom or other form of birth control (contraception) in order to prevent pregnancy and STIs (sexually transmitted infections). If you plan to become pregnant, see your health care provider for a preconception visit. What's next?  Visit your health care provider once a year for a well check visit.  Ask your health care provider how often you should have your eyes and teeth checked.  Stay up to date on all vaccines. This information is not intended to replace advice given to you by your health care provider. Make sure you discuss any questions you have with your health care provider. Document Revised: 07/21/2018 Document Reviewed: 07/21/2018 Elsevier Patient Education  2020 [employer].

## 2019-12-26 NOTE — Progress Notes (Signed)
GYNECOLOGY ANNUAL PREVENTATIVE CARE ENCOUNTER NOTE  History:     Julie Rowe is a 29 y.o. G48P1001 female here for a routine annual gynecologic exam.  Current complaints: none   Denies abnormal vaginal bleeding, discharge, pelvic pain, problems with intercourse or other gynecologic concerns.     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 Contraception: condoms Last Pap: 12/31/2017. Results were: normal Last mammogram: n/a  Obstetric History OB History  Gravida Para Term Preterm AB Living  1 1 1  0 0 1  SAB TAB Ectopic Multiple Live Births  0 0 0 0 1    # Outcome Date GA Lbr Len/2nd Weight Sex Delivery Anes PTL Lv  1 Term 06/20/19 [redacted]w[redacted]d 11:17 / 03:13 8 lb 8.9 oz (3.88 kg) M Vag-Spont EPI  LIV    Past Medical History:  Diagnosis Date  . Hypothyroid   . Thyroid disease     Past Surgical History:  Procedure Laterality Date  . TONSILLECTOMY    . TONSILLECTOMY AND ADENOIDECTOMY      Current Outpatient Medications on File Prior to Visit  Medication Sig Dispense Refill  . chlorhexidine (PERIDEX) 0.12 % solution 15 mLs 2 (two) times daily.    [redacted]w[redacted]d docusate sodium (COLACE) 100 MG capsule Take 1 capsule (100 mg total) by mouth 2 (two) times daily. 10 capsule 0  . hydrocortisone-pramoxine (PROCTOFOAM HC) rectal foam Place 1 applicator rectally 2 (two) times daily. 10 g 1  . Prenat-Fe Carbonyl-FA-Omega 3 (ONE-A-DAY WOMENS PRENATAL 1 PO)     . SYNTHROID 88 MCG tablet TAKE 1 TABLET BY MOUTH EVERY DAY 90 tablet 0  . Iron-FA-B Cmp-C-Biot-Probiotic (FUSION PLUS) CAPS Take 1 tablet by mouth daily. (Patient not taking: Reported on 08/08/2019) 30 capsule 4   No current facility-administered medications on file prior to visit.    No Known Allergies  Social History:   reports that she has never smoked. She has never used smokeless tobacco. She reports that she does not drink alcohol or use drugs.  Family History  Problem Relation Age of Onset  . Diabetes Mother   . Thyroid disease Mother   . Diabetes Brother   . Thyroid disease Brother   . Diabetes Maternal Grandmother   . Thyroid disease Paternal Grandmother     The following portions of the patient's history were reviewed and updated as appropriate: allergies, current medications, past family history, past medical history, past social history, past surgical history and problem list.  Review of Systems Pertinent items noted in HPI and remainder of comprehensive ROS otherwise negative.  Physical Exam:  BP 116/77   Pulse 65   Ht 5\' 6"  (1.676 m)   Wt 202 lb 9 oz (91.9 kg)   BMI 32.69 kg/m  CONSTITUTIONAL: Well-developed, well-nourished, over weight female in no acute distress.  HENT:  Normocephalic, atraumatic, External right and left ear normal. Oropharynx is clear and moist EYES: Conjunctivae and EOM are normal. Pupils are equal, round, and reactive to light. No scleral icterus.  NECK: Normal range of motion, supple, no masses.  Normal thyroid.  SKIN: Skin is warm and dry. No rash noted. Not diaphoretic. No erythema. No pallor. MUSCULOSKELETAL: Normal range of motion. No tenderness.  No cyanosis, clubbing, or edema.  2+ distal pulses. NEUROLOGIC: Alert and oriented to  person, place, and time. Normal reflexes, muscle tone coordination.  PSYCHIATRIC: Normal mood and affect. Normal behavior. Normal judgment and thought content. CARDIOVASCULAR: Normal heart rate noted, regular rhythm RESPIRATORY: Clear to auscultation bilaterally. Effort and breath sounds normal, no problems with respiration noted. BREASTS: Symmetric in size. No masses, tenderness, skin changes, nipple drainage, or lymphadenopathy bilaterally. ABDOMEN: Soft, no distention noted.  No tenderness, rebound or guarding.  PELVIC: Normal  appearing external genitalia and urethral meatus; normal appearing vaginal mucosa and cervix.  No abnormal discharge noted.  Pap smear not due. Normal uterine size, no other palpable masses, no uterine or adnexal tenderness.   Assessment and Plan:    1. Women's annual routine gynecological examination   Pap not indicated Mammogram n/a Labs:TSH  Refills: none  Routine preventative health maintenance measures emphasized. Please refer to After Visit Summary for other counseling recommendations.     Philip Aspen, CNM

## 2019-12-27 LAB — THYROID PANEL WITH TSH
Free Thyroxine Index: 2.3 (ref 1.2–4.9)
T3 Uptake Ratio: 29 % (ref 24–39)
T4, Total: 7.9 ug/dL (ref 4.5–12.0)
TSH: 2.23 u[IU]/mL (ref 0.450–4.500)

## 2020-02-27 DIAGNOSIS — L501 Idiopathic urticaria: Secondary | ICD-10-CM | POA: Diagnosis not present

## 2020-02-29 ENCOUNTER — Other Ambulatory Visit: Payer: Self-pay | Admitting: Certified Nurse Midwife

## 2020-03-04 ENCOUNTER — Ambulatory Visit: Payer: 59 | Attending: Internal Medicine

## 2020-03-04 DIAGNOSIS — Z23 Encounter for immunization: Secondary | ICD-10-CM

## 2020-03-04 NOTE — Progress Notes (Signed)
   Covid-19 Vaccination Clinic  Name:  Julie Rowe    MRN: [id number] DOB: Jan 02, 1991  03/04/2020  Julie Rowe was observed post Covid-19 immunization for 15 minutes without incident. She was provided with Vaccine Information Sheet and instruction to access the V-Safe system.   Julie Rowe was instructed to call 911 with any severe reactions post vaccine: Marland Kitchen Difficulty breathing  . Swelling of face and throat  . A fast heartbeat  . A bad rash all over body  . Dizziness and weakness   Immunizations Administered    Name Date Dose VIS Date Route   Pfizer COVID-19 Vaccine 03/04/2020  8:22 AM 0.3 mL 11/03/2019 Intramuscular   Manufacturer: [employer], [employer]   Lot: [id number]   NDC: [address]-0

## 2020-03-27 ENCOUNTER — Ambulatory Visit: Payer: 59 | Attending: Internal Medicine

## 2020-03-27 DIAGNOSIS — Z23 Encounter for immunization: Secondary | ICD-10-CM

## 2020-03-27 NOTE — Progress Notes (Signed)
   Covid-19 Vaccination Clinic  Name:  Julie Rowe    MRN: [id number] DOB: Nov 03, 1991  03/27/2020  [name] was observed post Covid-19 immunization for 15 minutes without incident. She was provided with Vaccine Information Sheet and instruction to access the V-Safe system.   [name] was instructed to call 911 with any severe reactions post vaccine: Marland Kitchen Difficulty breathing  . Swelling of face and throat  . A fast heartbeat  . A bad rash all over body  . Dizziness and weakness   Immunizations Administered    Name Date Dose VIS Date Route   Pfizer COVID-19 Vaccine 03/27/2020  2:58 PM 0.3 mL 01/17/2019 Intramuscular   Manufacturer: [employer], [employer]   Lot: [id number]   NDC: [address]-0

## 2020-05-23 ENCOUNTER — Other Ambulatory Visit: Payer: Self-pay | Admitting: Certified Nurse Midwife

## 2020-06-02 ENCOUNTER — Other Ambulatory Visit: Payer: Self-pay | Admitting: Certified Nurse Midwife

## 2020-06-03 ENCOUNTER — Telehealth: Payer: Self-pay

## 2020-06-03 MED ORDER — LEVOTHYROXINE SODIUM 88 MCG PO TABS: 88.0000 ug | ORAL_TABLET | Freq: Every day | ORAL | 1 refills | Status: DC

## 2020-06-03 NOTE — Telephone Encounter (Signed)
Levothyroxine sent to preferred pharmacy per patient request

## 2020-06-20 ENCOUNTER — Other Ambulatory Visit: Payer: Self-pay

## 2020-06-20 ENCOUNTER — Encounter: Payer: Self-pay | Admitting: *Deleted

## 2020-06-20 DIAGNOSIS — R112 Nausea with vomiting, unspecified: Secondary | ICD-10-CM | POA: Insufficient documentation

## 2020-06-20 DIAGNOSIS — R509 Fever, unspecified: Secondary | ICD-10-CM | POA: Insufficient documentation

## 2020-06-20 DIAGNOSIS — R42 Dizziness and giddiness: Secondary | ICD-10-CM | POA: Diagnosis not present

## 2020-06-20 DIAGNOSIS — Z79899 Other long term (current) drug therapy: Secondary | ICD-10-CM | POA: Diagnosis not present

## 2020-06-20 DIAGNOSIS — Z20822 Contact with and (suspected) exposure to covid-19: Secondary | ICD-10-CM | POA: Diagnosis not present

## 2020-06-20 DIAGNOSIS — E039 Hypothyroidism, unspecified: Secondary | ICD-10-CM | POA: Diagnosis not present

## 2020-06-20 DIAGNOSIS — R9431 Abnormal electrocardiogram [ECG] [EKG]: Secondary | ICD-10-CM | POA: Diagnosis not present

## 2020-06-20 LAB — BASIC METABOLIC PANEL
Anion gap: 9 (ref 5–15)
BUN: 17 mg/dL (ref 6–20)
CO2: 24 mmol/L (ref 22–32)
Calcium: 9 mg/dL (ref 8.9–10.3)
Chloride: 103 mmol/L (ref 98–111)
Creatinine, Ser: 0.72 mg/dL (ref 0.44–1.00)
GFR calc Af Amer: >60 mL/min (ref >60)
GFR calc non Af Amer: >60 mL/min (ref >60)
Glucose, Bld: 106 mg/dL — ABNORMAL HIGH (ref 70–99)
Potassium: 3.8 mmol/L (ref 3.5–5.1)
Sodium: 136 mmol/L (ref 135–145)

## 2020-06-20 LAB — CBC
HCT: 41 % (ref 36.0–46.0)
Hemoglobin: 13.9 g/dL (ref 12.0–15.0)
MCH: 31 pg (ref 26.0–34.0)
MCHC: 33.9 g/dL (ref 30.0–36.0)
MCV: 91.3 fL (ref 80.0–100.0)
Platelets: 164 10*3/uL (ref 150–400)
RBC: 4.49 MIL/uL (ref 3.87–5.11)
RDW: 12.6 % (ref 11.5–15.5)
WBC: 10.9 10*3/uL — ABNORMAL HIGH (ref 4.0–10.5)
nRBC: 0 % (ref 0.0–0.2)

## 2020-06-20 LAB — URINALYSIS, COMPLETE (UACMP) WITH MICROSCOPIC
Bilirubin Urine: NEGATIVE
Glucose, UA: NEGATIVE mg/dL
Ketones, ur: NEGATIVE mg/dL
Leukocytes,Ua: NEGATIVE
Nitrite: NEGATIVE
Protein, ur: NEGATIVE mg/dL
Specific Gravity, Urine: 1.017 (ref 1.005–1.030)
pH: 7 (ref 5.0–8.0)

## 2020-06-20 LAB — LACTIC ACID, PLASMA: Lactic Acid, Venous: 1.9 mmol/L (ref 0.5–1.9)

## 2020-06-20 MED ORDER — ONDANSETRON 4 MG PO TBDP: 4.0000 mg | ORAL_TABLET | Freq: Once | ORAL | Status: AC

## 2020-06-20 MED ORDER — ACETAMINOPHEN 325 MG PO TABS
ORAL_TABLET | ORAL | Status: AC
  Administered 2020-06-20: 650 mg via ORAL
  Filled 2020-06-20: qty 2

## 2020-06-20 MED ORDER — ONDANSETRON 4 MG PO TBDP
ORAL_TABLET | ORAL | Status: AC
  Administered 2020-06-20: 4 mg via ORAL
  Filled 2020-06-20: qty 1

## 2020-06-20 MED ORDER — ACETAMINOPHEN 325 MG PO TABS: 650.0000 mg | ORAL_TABLET | Freq: Once | ORAL | Status: AC

## 2020-06-20 MED ORDER — SODIUM CHLORIDE 0.9 % IV BOLUS
1000.0000 mL | Freq: Once | INTRAVENOUS | Status: AC
  Administered 2020-06-20: 1000 mL via INTRAVENOUS

## 2020-06-20 NOTE — ED Notes (Signed)
Pt became pale and reported feeling very lightheaded. Pts eyes rolled back in her head but was able to remain sitting on bench. Pt diaphoretic. Near syncopal episode but pt did not lose consciousness completely. IV placed and fluids being administered.

## 2020-06-20 NOTE — ED Notes (Signed)
Family room - pumping

## 2020-06-20 NOTE — ED Triage Notes (Signed)
Pt to ED reporting vomiting and dizziness that started at 15:00 today. Pt reports she has been shivering but has not checked her fever. No others around her have been sick. PT unable to keep fluid down and is currently breast feeding her 29 year old.

## 2020-06-21 ENCOUNTER — Emergency Department: Payer: 59

## 2020-06-21 ENCOUNTER — Emergency Department
Admission: EM | Admit: 2020-06-21 | Discharge: 2020-06-21 | Disposition: A | Discharge status: Home / Self Care | Payer: 59 | Attending: Emergency Medicine | Admitting: Emergency Medicine

## 2020-06-21 DIAGNOSIS — R509 Fever, unspecified: Secondary | ICD-10-CM | POA: Diagnosis not present

## 2020-06-21 DIAGNOSIS — R112 Nausea with vomiting, unspecified: Secondary | ICD-10-CM

## 2020-06-21 DIAGNOSIS — R42 Dizziness and giddiness: Secondary | ICD-10-CM | POA: Diagnosis not present

## 2020-06-21 LAB — PREGNANCY, URINE: Preg Test, Ur: NEGATIVE

## 2020-06-21 LAB — SARS CORONAVIRUS 2 BY RT PCR (HOSPITAL ORDER, PERFORMED IN ~~LOC~~ HOSPITAL LAB): SARS Coronavirus 2: NEGATIVE

## 2020-06-21 NOTE — ED Provider Notes (Signed)
[facility] Emergency Department Provider Note  ____________________________________________  Time seen: Approximately 1:23 AM  I have reviewed the triage vital signs and the nursing notes.   HISTORY  Chief Complaint Emesis and Dizziness   HPI Julie Rowe is a 29 y.o. female with a history of hypothyroidism who presents for evaluation of vomiting and dizziness. Patient reports that she had nausea and several episodes of nonbloody nonbilious emesis earlier today. She started feeling lightheaded. She felt like she was very dehydrated. She also reports that she was shivering and having chills. No sore throat, no cough, no congestion, no headache, no neck stiffness, no loss of taste or smell, no chest pain or shortness of breath, no abdominal pain, no diarrhea constipation, no dysuria or hematuria. Patient has been fully vaccinated for Covid. Patient reports going to the zoo yesterday with her family. Was very hot and she barely drink anything. She reports that she felt like she was dehydrated at home.   Patient is still breast-feeding her 53-year-old at home.  No breast pain or tenderness or redness.  Past Medical History:  Diagnosis Date  . Hypothyroid   . Thyroid disease     Patient Active Problem List   Diagnosis Date Noted  . Hypothyroidism due to Hashimoto's thyroiditis 10/21/2015    Past Surgical History:  Procedure Laterality Date  . TONSILLECTOMY    . TONSILLECTOMY AND ADENOIDECTOMY      Prior to Admission medications   Medication Sig Start Date End Date Taking? Authorizing Provider  chlorhexidine (PERIDEX) 0.12 % solution 15 mLs 2 (two) times daily. 11/13/19   [provider]  docusate sodium (COLACE) 100 MG capsule Take 1 capsule (100 mg total) by mouth 2 (two) times daily. 08/15/19   Doreene Burke, CNM  hydrocortisone-pramoxine (PROCTOFOAM [facility]) rectal foam Place 1 applicator rectally 2 (two) times daily. 08/15/19   Doreene Burke, CNM   Iron-FA-B Cmp-C-Biot-Probiotic (FUSION PLUS) CAPS Take 1 tablet by mouth daily. Patient not taking: Reported on 08/08/2019 06/11/19   Doreene Burke, CNM  levothyroxine (SYNTHROID) 88 MCG tablet Take 1 tablet (88 mcg total) by mouth daily before breakfast. 06/03/20   Doreene Burke, CNM  Prenat-Fe Carbonyl-FA-Omega 3 (ONE-A-DAY WOMENS PRENATAL 1 PO)  01/21/18   [provider]  SYNTHROID 88 MCG tablet TAKE 1 TABLET BY MOUTH EVERY DAY 05/23/20   Doreene Burke, CNM    Allergies Patient has no known allergies.  Family History  Problem Relation Age of Onset  . Diabetes Mother   . Thyroid disease Mother   . Diabetes Brother   . Thyroid disease Brother   . Diabetes Maternal Grandmother   . Thyroid disease Paternal Grandmother     Social History Social History   Tobacco Use  . Smoking status: Never Smoker  . Smokeless tobacco: Never Used  Vaping Use  . Vaping Use: Never used  Substance Use Topics  . Alcohol use: No  . Drug use: No    Review of Systems  Constitutional: + fever. + dizziness Eyes: Negative for visual changes. ENT: Negative for sore throat. Neck: No neck pain  Cardiovascular: Negative for chest pain. Respiratory: Negative for shortness of breath. Gastrointestinal: Negative for abdominal pain, vomiting or diarrhea. + N/V Genitourinary: Negative for dysuria. Musculoskeletal: Negative for back pain. Skin: Negative for rash. Neurological: Negative for headaches, weakness or numbness. Psych: No SI or HI  ____________________________________________   PHYSICAL EXAM:  VITAL SIGNS: Vitals:   06/21/20 0116 06/21/20 0129  BP:  (!) 98/63  Pulse:  72  Resp:  17  Temp: 98.7 F (37.1 C)   SpO2:  100%    Constitutional: Alert and oriented. Well appearing and in no apparent distress. HEENT:      Head: Normocephalic and atraumatic.         Eyes: Conjunctivae are normal. Sclera is non-icteric.       Mouth/Throat: Mucous membranes are moist.       Neck:  Supple with no signs of meningismus. Cardiovascular: Regular rate and rhythm. No murmurs, gallops, or rubs. 2+ symmetrical distal pulses are present in all extremities. No JVD. Respiratory: Normal respiratory effort. Lungs are clear to auscultation bilaterally. Gastrointestinal: Soft, non tender. Musculoskeletal: No edema, cyanosis, or erythema of extremities. Neurologic: Normal speech and language. Face is symmetric. Moving all extremities. No gross focal neurologic deficits are appreciated. Skin: Skin is warm, dry and intact. No rash noted. Psychiatric: Mood and affect are normal. Speech and behavior are normal.  ____________________________________________   LABS (all labs ordered are listed, but only abnormal results are displayed)  Labs Reviewed  BASIC METABOLIC PANEL - Abnormal; Notable for the following components:      Result Value   Glucose, Bld 106 (*)    All other components within normal limits  CBC - Abnormal; Notable for the following components:   WBC 10.9 (*)    All other components within normal limits  URINALYSIS, COMPLETE (UACMP) WITH MICROSCOPIC - Abnormal; Notable for the following components:   Color, Urine YELLOW (*)    APPearance HAZY (*)    Hgb urine dipstick LARGE (*)    Bacteria, UA RARE (*)    All other components within normal limits  SARS CORONAVIRUS 2 BY RT PCR (HOSPITAL ORDER, PERFORMED IN [facility])  LACTIC ACID, PLASMA  PREGNANCY, URINE   ____________________________________________  EKG  ED ECG REPORT I, Nita Sickle, the attending physician, personally viewed and interpreted this ECG.  Normal sinus rhythm, rate of 99, normal intervals, right axis deviation, no ST elevations or depressions. No prior for comparison. ____________________________________________  RADIOLOGY  I have personally reviewed the images performed during this visit and I agree with the Radiologist's read.   Interpretation by Radiologist:  DG  Chest 2 View  Result Date: 06/21/2020 CLINICAL DATA:  Fevers and dizziness EXAM: CHEST - 2 VIEW COMPARISON:  None. FINDINGS: The heart size and mediastinal contours are within normal limits. Both lungs are clear. The visualized skeletal structures are unremarkable. IMPRESSION: No active cardiopulmonary disease. Electronically Signed   By: Alcide Clever M.D.   On: 06/21/2020 01:58      ____________________________________________   PROCEDURES  Procedure(s) performed:yes .1-3 Lead EKG Interpretation Performed by: Nita Sickle, MD Authorized by: Nita Sickle, MD     Interpretation: normal     ECG rate assessment: normal     Rhythm: sinus rhythm     Ectopy: none     Critical Care performed:  None ____________________________________________   INITIAL IMPRESSION / ASSESSMENT AND PLAN / ED COURSE  29 y.o. female with a history of hypothyroidism who presents for evaluation of vomiting and dizziness.  Upon arrival to the emergency room patient had a fever of 102.60F and tachycardic.  After Tylenol and IV fluids in the waiting room her fever and tachycardia resolved.  Patient has clear lungs with normal work of breathing, abdomen is soft with no tenderness.  Covid swab is pending.  Chest x-ray showing no evidence of infection, confirmed by radiology. UA negative for UTI. Borderline  leukocytosis with normal lactic acid.   Patient is extremely well-appearing with no signs of an infection both on history and physical exam other than possibly Covid which is pending versus another viral illness.  Low suspicion for intra-abdominal pathology with no abdominal pain, no abdominal tenderness.  At this time patient looks stable for discharge home.  Discussed close follow-up with PCP.  Discussed my standard return precautions especially for any signs of abdominal pain, chest pain, shortness of breath.     _________________________ 3:47 AM on  06/21/2020 -----------------------------------------   COVID negative  _____________________________________________ Please note:  Patient was evaluated in Emergency Department today for the symptoms described in the history of present illness. Patient was evaluated in the context of the global COVID-19 pandemic, which necessitated consideration that the patient might be at risk for infection with the SARS-CoV-2 virus that causes COVID-19. Institutional protocols and algorithms that pertain to the evaluation of patients at risk for COVID-19 are in a state of rapid change based on information released by regulatory bodies including the CDC and federal and state organizations. These policies and algorithms were followed during the patient's care in the ED.  Some ED evaluations and interventions may be delayed as a result of limited staffing during the pandemic.   [place] Controlled Substance Database was reviewed by me. ____________________________________________   FINAL CLINICAL IMPRESSION(S) / ED DIAGNOSES   Final diagnoses:  Fever, unspecified fever cause  Non-intractable vomiting with nausea, unspecified vomiting type      NEW MEDICATIONS STARTED DURING THIS VISIT:  ED Discharge Orders    None       Note:  This document was prepared using Dragon voice recognition software and may include unintentional dictation errors.    Nita Sickle, MD 06/21/20 [phone]

## 2020-06-21 NOTE — ED Notes (Signed)
Pt transported to xray 

## 2020-06-24 ENCOUNTER — Ambulatory Visit (INDEPENDENT_AMBULATORY_CARE_PROVIDER_SITE_OTHER): Payer: 59 | Admitting: Certified Nurse Midwife

## 2020-06-24 ENCOUNTER — Encounter: Payer: Self-pay | Admitting: Certified Nurse Midwife

## 2020-06-24 VITALS — BP 106/73 | HR 61 | Ht 66.0 in | Wt 203.6 lb

## 2020-06-24 DIAGNOSIS — Z09 Encounter for follow-up examination after completed treatment for conditions other than malignant neoplasm: Secondary | ICD-10-CM | POA: Diagnosis not present

## 2020-06-24 NOTE — Patient Instructions (Signed)
Preventive Care 21-29 Years Old, Female Preventive care refers to visits with your health care provider and lifestyle choices that can promote health and wellness. This includes:  A yearly physical exam. This may also be called an annual well check.  Regular dental visits and eye exams.  Immunizations.  Screening for certain conditions.  Healthy lifestyle choices, such as eating a healthy diet, getting regular exercise, not using drugs or products that contain nicotine and tobacco, and limiting alcohol use. What can I expect for my preventive care visit? Physical exam Your health care provider will check your:  Height and weight. This may be used to calculate body mass index (BMI), which tells if you are at a healthy weight.  Heart rate and blood pressure.  Skin for abnormal spots. Counseling Your health care provider may ask you questions about your:  Alcohol, tobacco, and drug use.  Emotional well-being.  Home and relationship well-being.  Sexual activity.  Eating habits.  Work and work environment.  Method of birth control.  Menstrual cycle.  Pregnancy history. What immunizations do I need?  Influenza (flu) vaccine  This is recommended every year. Tetanus, diphtheria, and pertussis (Tdap) vaccine  You may need a Td booster every 10 years. Varicella (chickenpox) vaccine  You may need this if you have not been vaccinated. Human papillomavirus (HPV) vaccine  If recommended by your health care provider, you may need three doses over 6 months. Measles, mumps, and rubella (MMR) vaccine  You may need at least one dose of MMR. You may also need a second dose. Meningococcal conjugate (MenACWY) vaccine  One dose is recommended if you are age 19-21 years and a first-year college student living in a residence hall, or if you have one of several medical conditions. You may also need additional booster doses. Pneumococcal conjugate (PCV13) vaccine  You may need  this if you have certain conditions and were not previously vaccinated. Pneumococcal polysaccharide (PPSV23) vaccine  You may need one or two doses if you smoke cigarettes or if you have certain conditions. Hepatitis A vaccine  You may need this if you have certain conditions or if you travel or work in places where you may be exposed to hepatitis A. Hepatitis B vaccine  You may need this if you have certain conditions or if you travel or work in places where you may be exposed to hepatitis B. Haemophilus influenzae type b (Hib) vaccine  You may need this if you have certain conditions. You may receive vaccines as individual doses or as more than one vaccine together in one shot (combination vaccines). Talk with your health care provider about the risks and benefits of combination vaccines. What tests do I need?  Blood tests  Lipid and cholesterol levels. These may be checked every 5 years starting at age 20.  Hepatitis C test.  Hepatitis B test. Screening  Diabetes screening. This is done by checking your blood sugar (glucose) after you have not eaten for a while (fasting).  Sexually transmitted disease (STD) testing.  BRCA-related cancer screening. This may be done if you have a family history of breast, ovarian, tubal, or peritoneal cancers.  Pelvic exam and Pap test. This may be done every 3 years starting at age 21. Starting at age 30, this may be done every 5 years if you have a Pap test in combination with an HPV test. Talk with your health care provider about your test results, treatment options, and if necessary, the need for more tests.   Follow these instructions at home: Eating and drinking   Eat a diet that includes fresh fruits and vegetables, whole grains, lean protein, and low-fat dairy.  Take vitamin and mineral supplements as recommended by your health care provider.  Do not drink alcohol if: ? Your health care provider tells you not to drink. ? You are  pregnant, may be pregnant, or are planning to become pregnant.  If you drink alcohol: ? Limit how much you have to 0-1 drink a day. ? Be aware of how much alcohol is in your drink. In the [place] one drink equals one 12 oz bottle of beer (355 mL), one 5 oz glass of wine (148 mL), or one 1 oz glass of hard liquor (44 mL). Lifestyle  Take daily care of your teeth and gums.  Stay active. Exercise for at least 30 minutes on 5 or more days each week.  Do not use any products that contain nicotine or tobacco, such as cigarettes, e-cigarettes, and chewing tobacco. If you need help quitting, ask your health care provider.  If you are sexually active, practice safe sex. Use a condom or other form of birth control (contraception) in order to prevent pregnancy and STIs (sexually transmitted infections). If you plan to become pregnant, see your health care provider for a preconception visit. What's next?  Visit your health care provider once a year for a well check visit.  Ask your health care provider how often you should have your eyes and teeth checked.  Stay up to date on all vaccines. This information is not intended to replace advice given to you by your health care provider. Make sure you discuss any questions you have with your health care provider. Document Revised: 07/21/2018 Document Reviewed: 07/21/2018 Elsevier Patient Education  2020 [employer].

## 2020-06-24 NOTE — Progress Notes (Signed)
  [facility] [address] [place] [address] Dept: [phone] Dept Fax: [phone]  Medical Follow-up  Patient ID: Julie Rowe, female  DOB: 19-Dec-1990, 29 y.o.  MRN: [id number]  Date of Evaluation: 06/24/2020  PCP: Doreene Burke, CNM  Accompanied by: no one with her  Patient Lives with: spouse and child  HISTORY/CURRENT STATUS:  HPI Pt seen in the ED on 06/21/20 for N&V and dizziness. Pt state she had gone to the zoo prior to visit. Likely over heated and dehydrated. Was treated with IV fluids and medications. States she feels much better with an occasional headache that she is taking tylenol for.   MEDICAL HISTORY: Appetite: normal *  Individual Medical History/Review of System Changes? No  Allergies: Patient has no known allergies.  Current Medications:  Current Outpatient Medications:  .  levothyroxine (SYNTHROID) 88 MCG tablet, Take 1 tablet (88 mcg total) by mouth daily before breakfast., Disp: 90 tablet, Rfl: 1 .  Iron-FA-B Cmp-C-Biot-Probiotic (FUSION PLUS) CAPS, Take 1 tablet by mouth daily. (Patient not taking: Reported on 08/08/2019), Disp: 30 capsule, Rfl: 4 .  SYNTHROID 88 MCG tablet, TAKE 1 TABLET BY MOUTH EVERY DAY, Disp: 90 tablet, Rfl: 0 Medication Side Effects: None  Family Medical/Social History Changes?: No  MENTAL HEALTH: Mental Health Issues: none  PHYSICAL EXAM: Vitals:  Today's Vitals   06/24/20 1017  BP: 106/73  Pulse: 61  Weight: (!) 203 lb 9 oz (92.3 kg)  Height: 5\' 6"  (1.676 m)  , Facility age limit for growth percentiles is 20 years.  General Exam: Physical Exam  Neurological: oriented to time, place, and person Cranial Nerves: normal  Neuromuscular: WNL DTRs: normal   DIAGNOSES: Dehydration, heat exhaustion   RECOMMENDATIONS: PO hydration , tylenol as needed  NEXT APPOINTMENT: No follow-ups on file.   , CNM Counseling Time: 10 Total Contact Time:  10

## 2020-06-29 IMAGING — US OBSTETRIC 14+ WK ULTRASOUND
1 series · 13 of 28 positions shown · non-contrast
Comparison: none

CLINICAL DATA: Anatomy evaluation

EXAM:
OBSTETRICAL ULTRASOUND >14 WKS

[Series 1: obstetric 14+ wk ultrasound · 0.28mm/px · 13 of 83 slices shown]
[im 4/83]
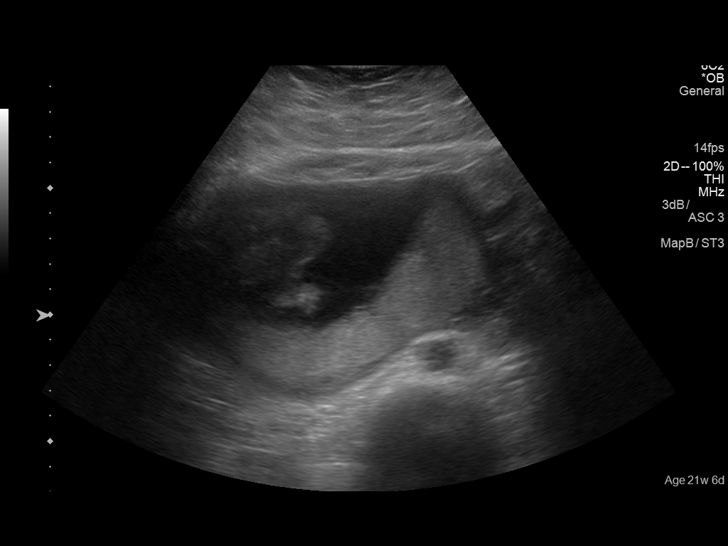
[im 10/83]
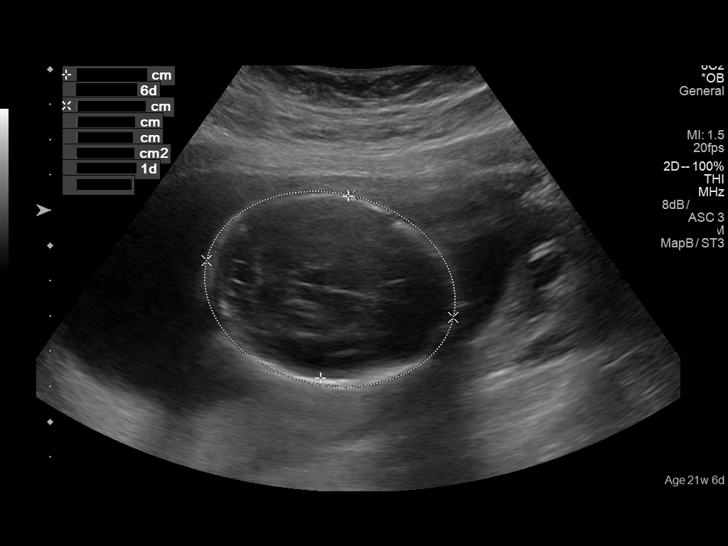
[im 16/83]
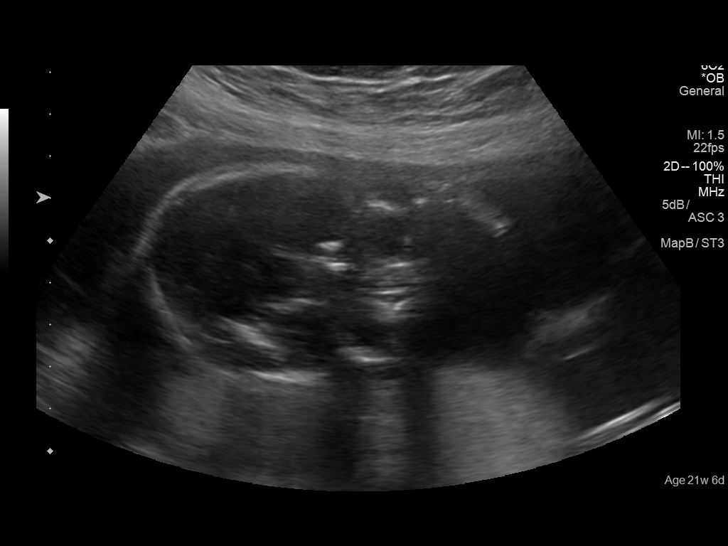
[im 22/83]
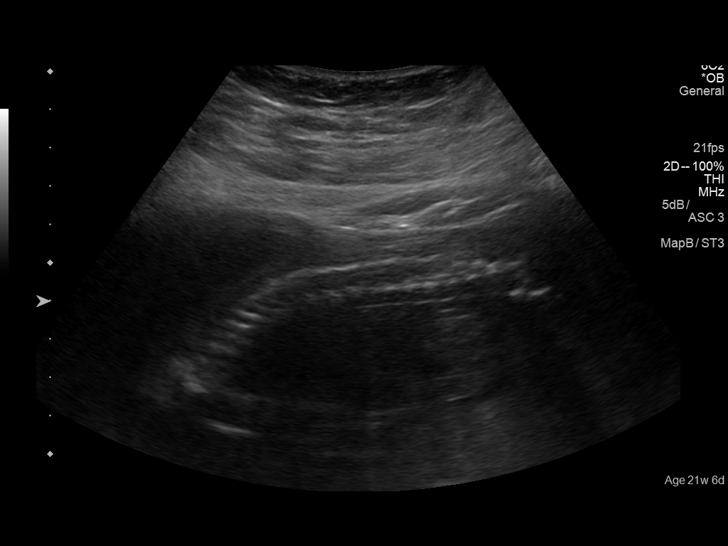
[im 28/83]
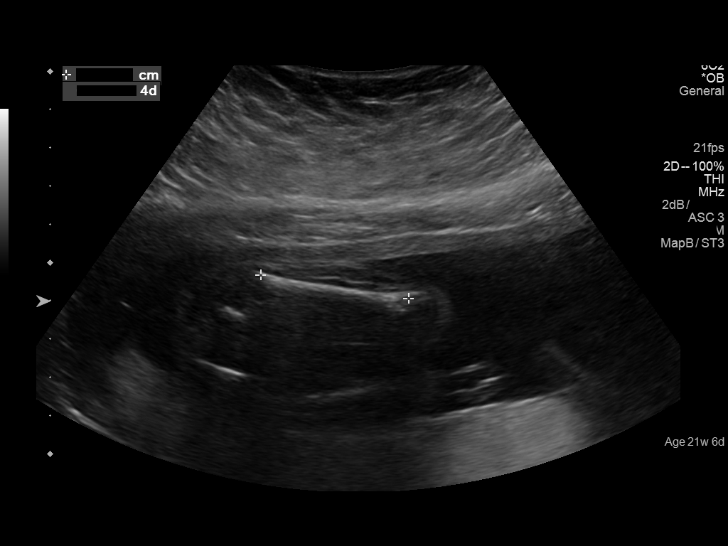
[im 34/83]
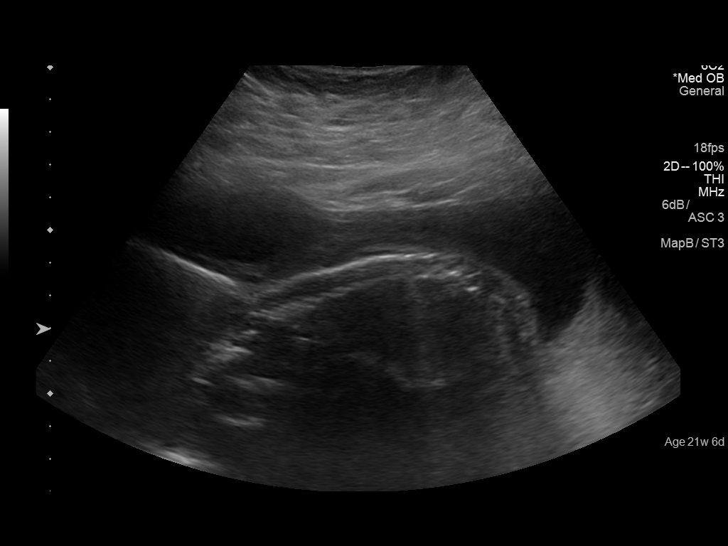
[im 43/83]
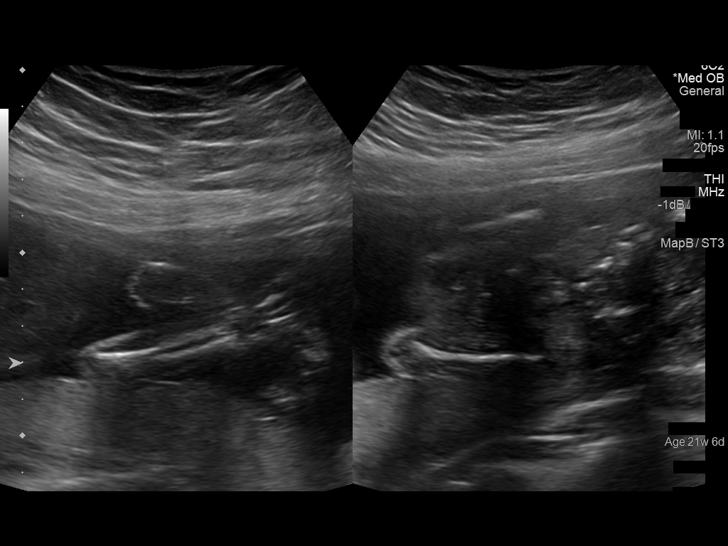
[im 49/83]
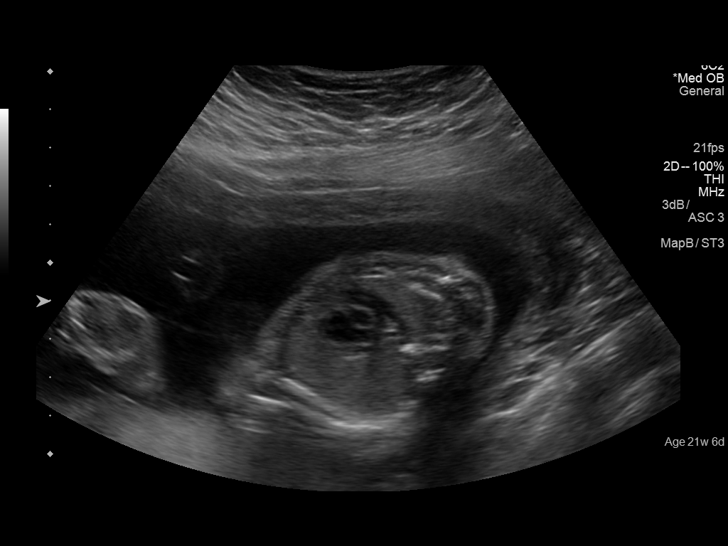
[im 55/83]
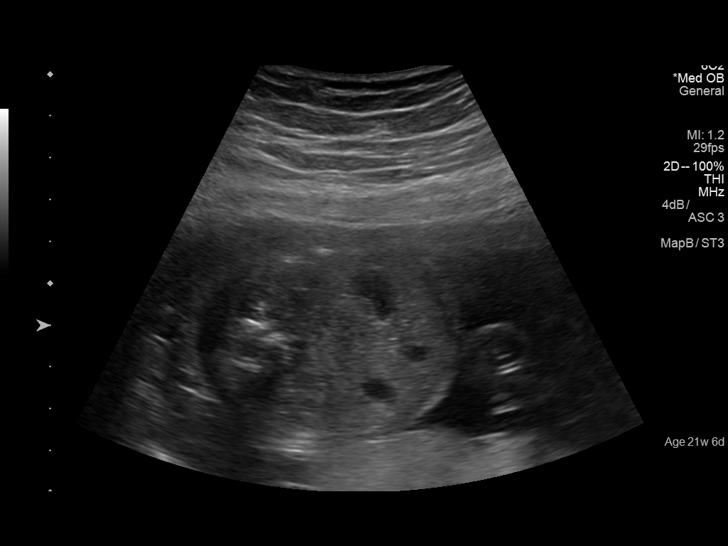
[im 61/83]
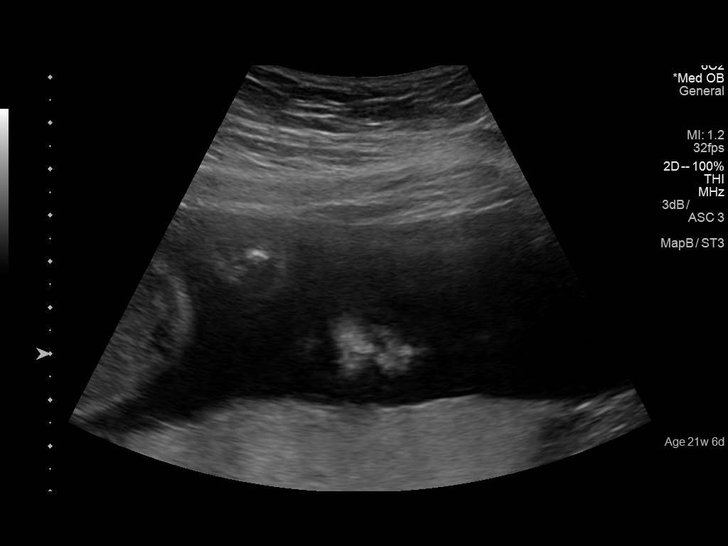
[im 67/83]
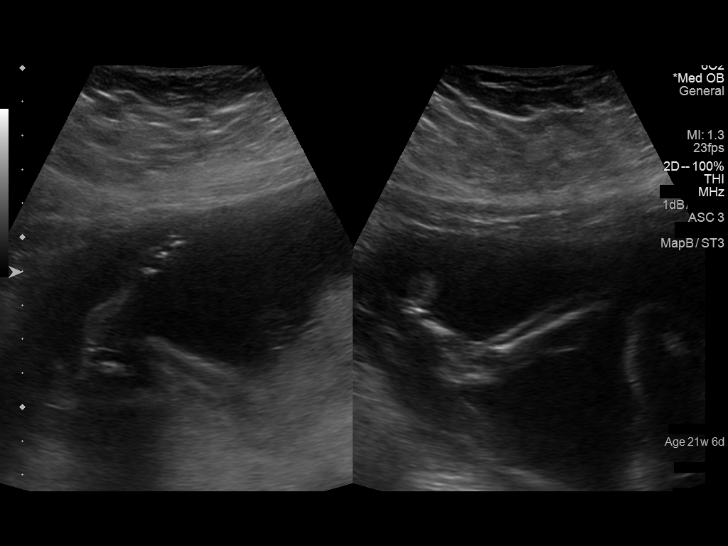
[im 73/83]
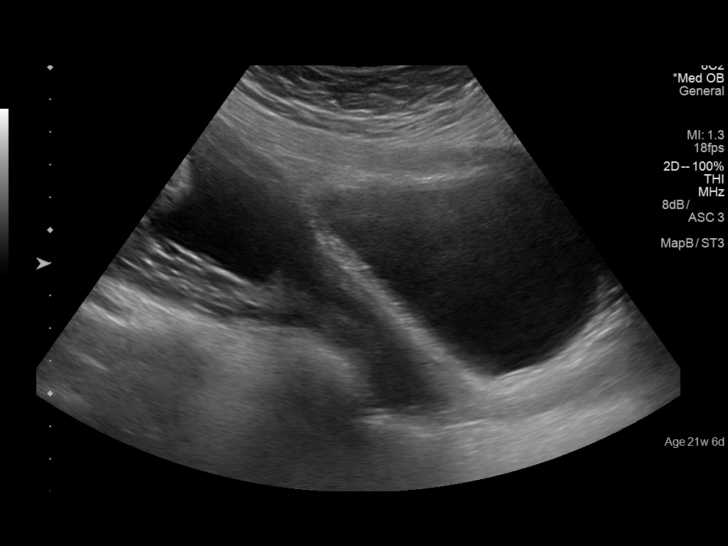
[im 79/83]
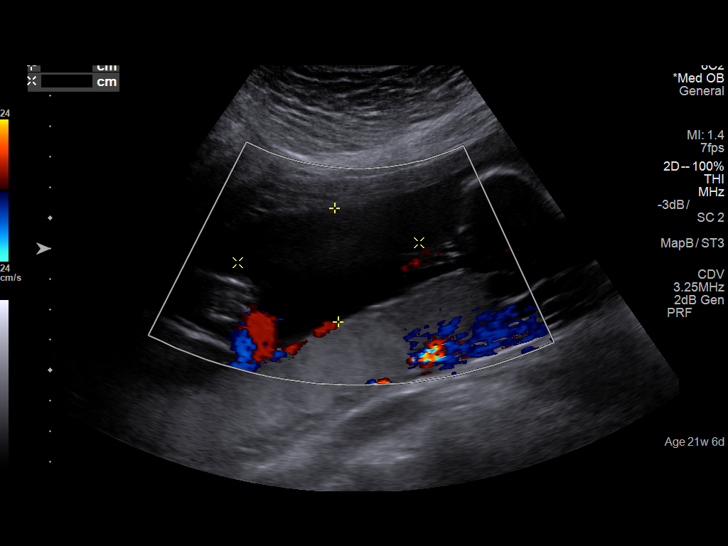

[13 of 28 positions shown; findings below may reference images not displayed]

FINDINGS: Number of Fetuses: 1

Heart Rate:  143 bpm

Movement: Yes

Presentation: Variable

Previa: Marginal

Placental Location: Posterior

Amniotic Fluid (Subjective): Normal

Amniotic Fluid (Objective):

Vertical pocket = 3.7cm

FETAL BIOMETRY

BPD: 5.26cm 22w 0d

HC:   19.99cm 22w 1d

AC:   16.64cm 21w 5d

FL:   3.9cm 22Chinglam Khodke

Current Mean GA: 22w 2d US EDC: 06/10/2019

Assigned GA:  21w 6d Assigned EDC: 06/13/2019

FETAL ANATOMY

Lateral Ventricles: Appears normal

Thalami/CSP: Appears normal

Posterior Fossa:  Appears normal

Nuchal Region: Appears normal   NFT= 4 mm

Upper Lip: Appears normal

Spine: Appears normal

4 Chamber Heart on Left: Appears normal

LVOT: Appears normal

RVOT: Appears normal

Stomach on Left: Appears normal

3 Vessel Cord: Appears normal

Cord Insertion site: Appears normal

Kidneys: Appears normal

Bladder: Appears normal

Extremities: Appears normal

Sex: Male

Technically difficult due to: Maternal habitus

Maternal Findings:

Cervix:  Normal cervical length measuring 4.5 cm.  Cervix is closed.
IMPRESSION: Single live intrauterine pregnancy as detailed above.

## 2020-12-04 IMAGING — CR DG CHEST 2V
2 series · 2 of 2 positions shown · non-contrast
Comparison: None.

CLINICAL DATA: Fevers and dizziness

EXAM:
CHEST - 2 VIEW

[chest pa]
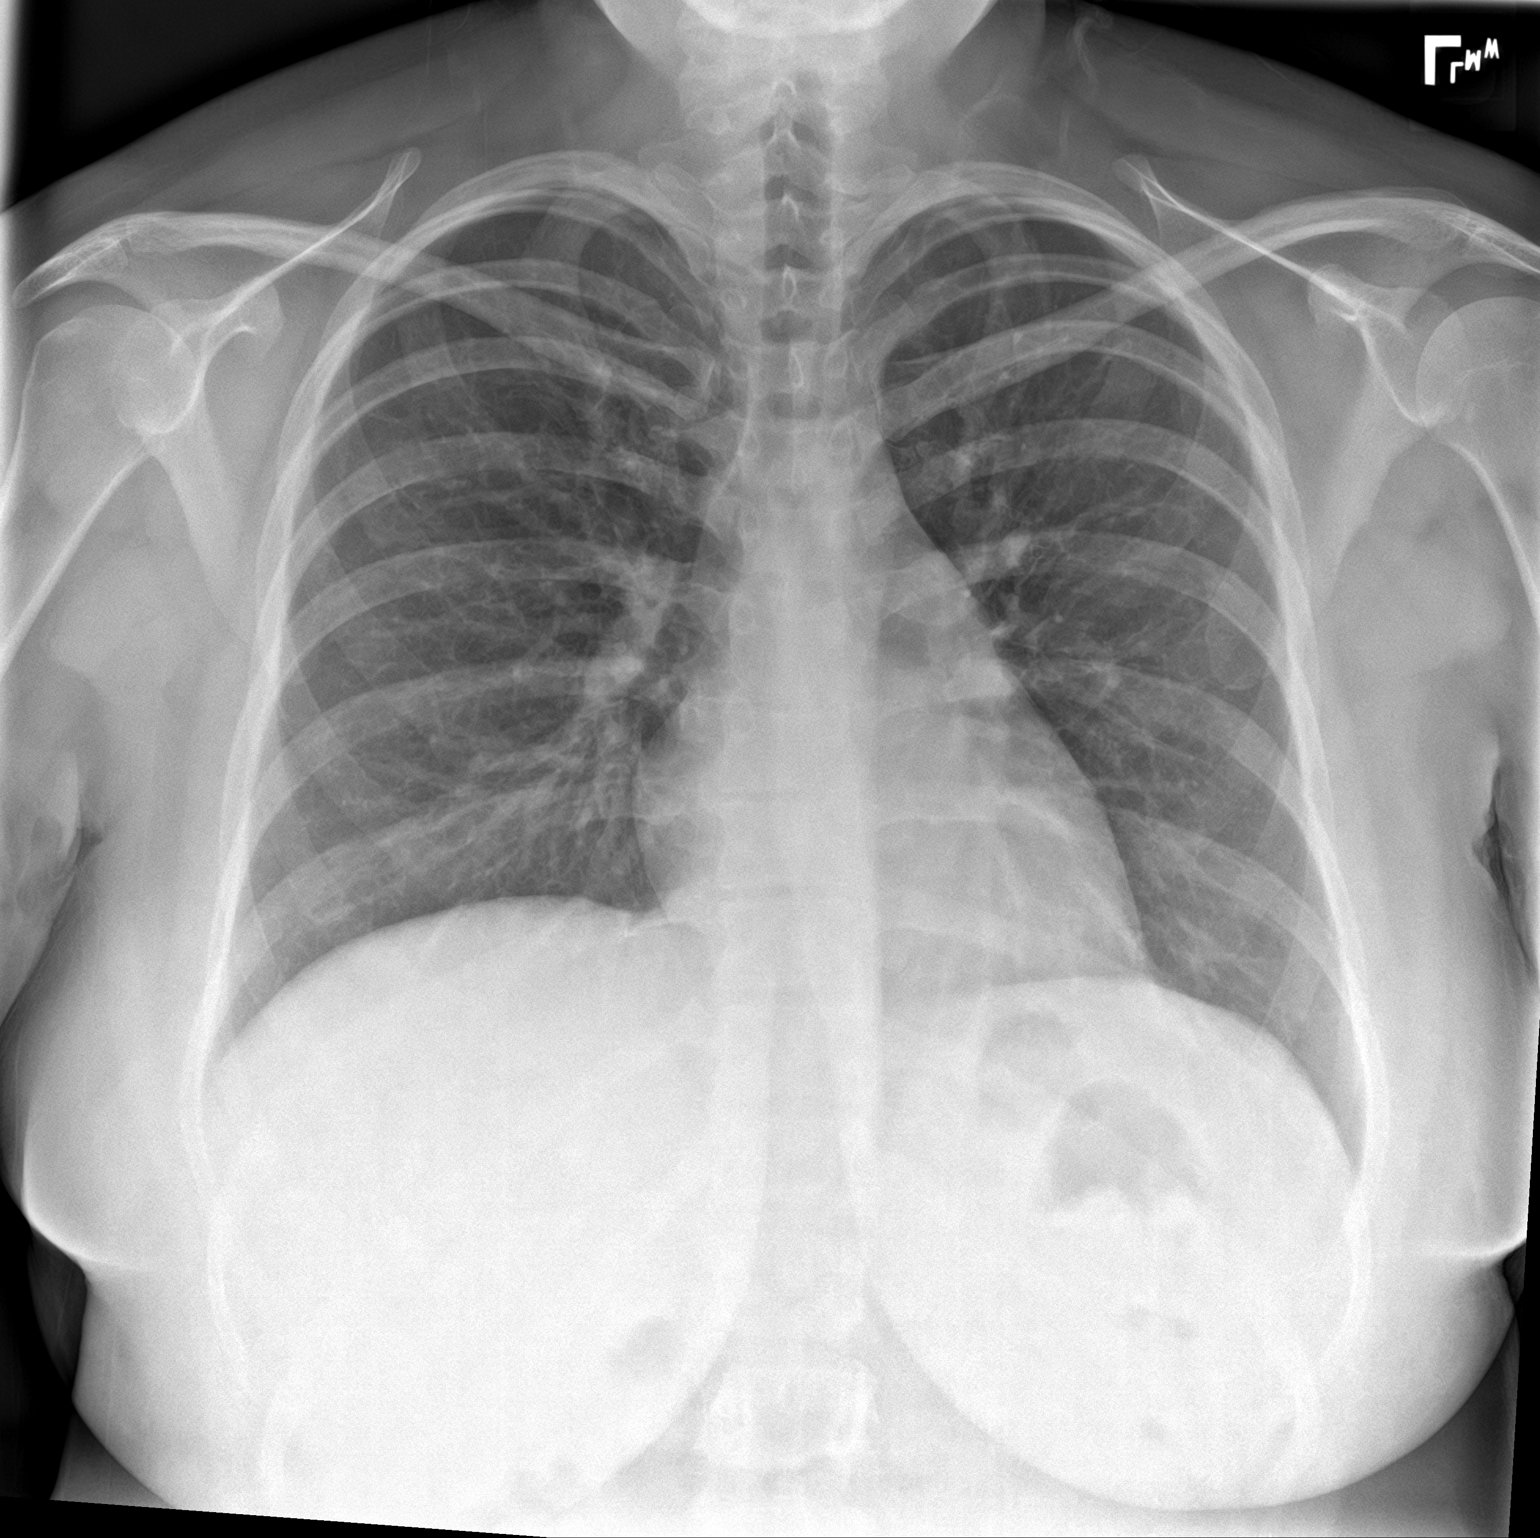

[chest lat]
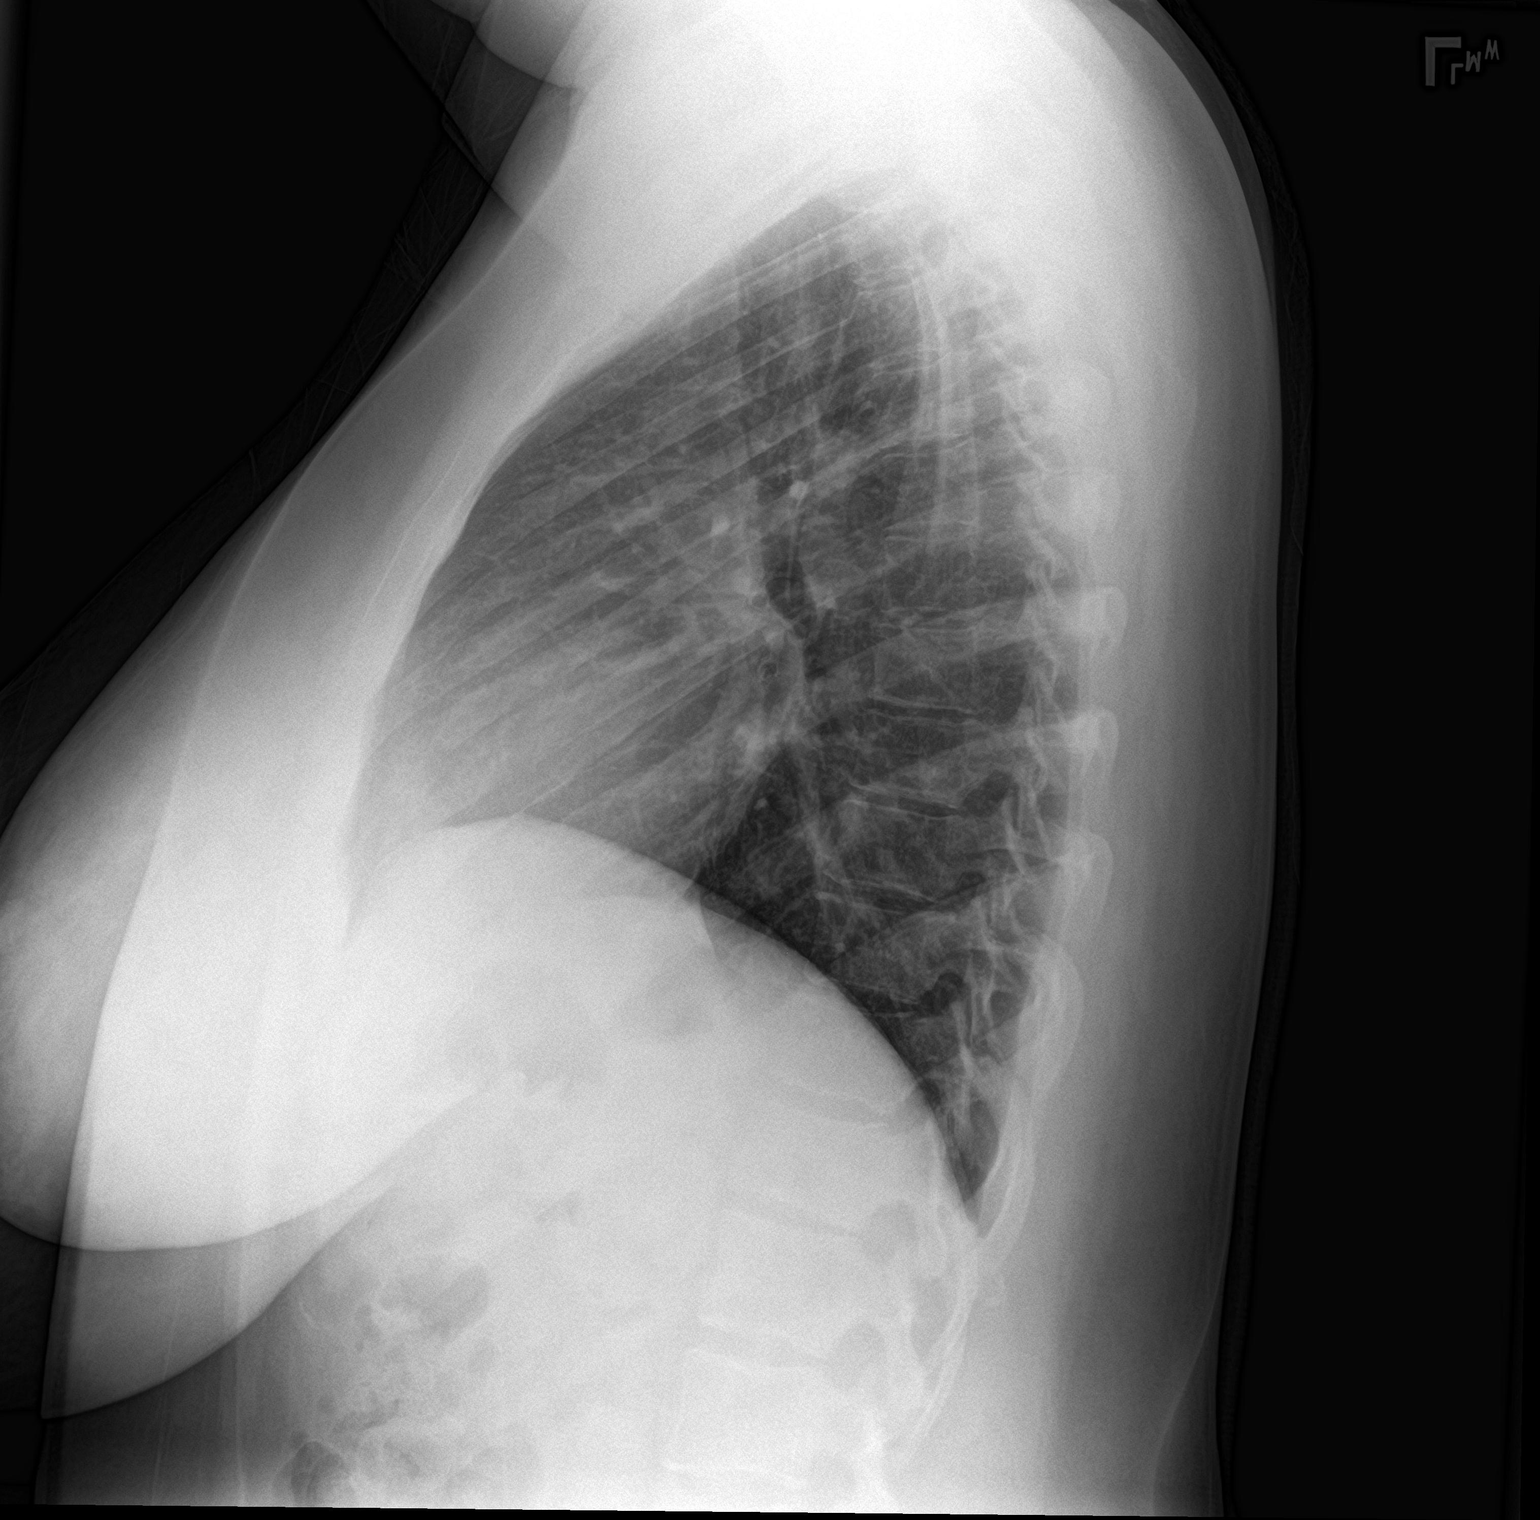

[2 of 2 positions shown; findings below may reference images not displayed]

FINDINGS: The heart size and mediastinal contours are within normal limits.
Both lungs are clear. The visualized skeletal structures are
unremarkable.
IMPRESSION: No active cardiopulmonary disease.

## 2020-12-11 ENCOUNTER — Telehealth: Payer: Self-pay

## 2020-12-11 ENCOUNTER — Encounter: Payer: 59 | Admitting: Certified Nurse Midwife

## 2020-12-11 ENCOUNTER — Other Ambulatory Visit: Payer: Self-pay | Admitting: Certified Nurse Midwife

## 2020-12-11 NOTE — Telephone Encounter (Signed)
Pt called in and stated that her husband tested positive for covid. The pt is going to get tested today. I told her we will cancel you appt for today and that a nurse will give you a call. Please advise

## 2020-12-11 NOTE — Telephone Encounter (Signed)
mychart message sent to patient

## 2020-12-12 ENCOUNTER — Other Ambulatory Visit: Payer: Self-pay

## 2020-12-12 DIAGNOSIS — Z1152 Encounter for screening for COVID-19: Secondary | ICD-10-CM

## 2020-12-14 LAB — SARS-COV-2, NAA 2 DAY TAT

## 2020-12-14 LAB — NOVEL CORONAVIRUS, NAA: SARS-CoV-2, NAA: DETECTED — AB

## 2020-12-16 ENCOUNTER — Other Ambulatory Visit: Payer: Self-pay

## 2020-12-18 ENCOUNTER — Other Ambulatory Visit: Payer: Self-pay

## 2020-12-18 ENCOUNTER — Encounter: Payer: Self-pay | Admitting: Certified Nurse Midwife

## 2020-12-18 ENCOUNTER — Ambulatory Visit (INDEPENDENT_AMBULATORY_CARE_PROVIDER_SITE_OTHER): Payer: 59 | Admitting: Certified Nurse Midwife

## 2020-12-18 VITALS — BP 115/84 | HR 86 | Ht 66.0 in | Wt 219.4 lb

## 2020-12-18 DIAGNOSIS — N926 Irregular menstruation, unspecified: Secondary | ICD-10-CM

## 2020-12-18 LAB — POCT URINE PREGNANCY: Preg Test, Ur: POSITIVE — AB

## 2020-12-18 NOTE — Progress Notes (Signed)
Subjective:    Julie Rowe is a 30 y.o. female who presents for evaluation of amenorrhea. She believes she could be pregnant. Pregnancy is desired. Sexual Activity: single partner, contraception: none. Current symptoms also include: fatigue and nausea. Last period was normal.   Patient's last menstrual period was 11/03/2020 (exact date). The following portions of the patient's history were reviewed and updated as appropriate: allergies, current medications, past family history, past medical history, past social history, past surgical history and problem list.  Review of Systems Pertinent items are noted in HPI.     Objective:    BP 115/84   Pulse 86   Ht 5\' 6"  (1.676 m)   Wt 219 lb 6 oz (99.5 kg)   LMP 11/03/2020 (Exact Date)   Breastfeeding No   BMI 35.41 kg/m  General: alert, cooperative, appears stated age, no distress and no acute distress    Lab Review Urine HCG: positive    Assessment:    Absence of menstruation.     Plan:    Pregnancy Test: Positive: EDC: 08/10/21. Briefly discussed pre-natal care options. MD and midwifery care reviewed. PT plans to see midwives. Encouraged well-balanced diet, plenty of rest when needed, pre-natal vitamins daily and walking for exercise. Discussed self-help for nausea, avoiding OTC medications until consulting provider or pharmacist, other than Tylenol as needed, minimal caffeine (1-2 cups daily) and avoiding alcohol. She will schedule dating u/s in 2 wks, nurse visit in 4 wks and her initial OB visit in 6 wks. Feel free to call with any question   08/12/21, CNM

## 2020-12-18 NOTE — Patient Instructions (Signed)
https://www.[employer].org/womens-health/faqs/prenatal-genetic-screening-tests">  Prenatal Care Prenatal care is health care during pregnancy. It helps you and your unborn baby (fetus) stay as healthy as possible. Prenatal care may be provided by a midwife, a family practice doctor, a mid-level practitioner (nurse practitioner or physician assistant), or a childbirth and pregnancy doctor (obstetrician). How does this affect me? During pregnancy, you will be closely monitored for any new conditions that might develop. To lower your risk of pregnancy complications, you and your health care provider will talk about any underlying conditions you have. How does this affect my baby? Early and consistent prenatal care increases the chance that your baby will be healthy during pregnancy. Prenatal care lowers the risk that your baby will be:  Born early (prematurely).  Smaller than expected at birth (small for gestational age). What can I expect at the first prenatal care visit? Your first prenatal care visit will likely be the longest. You should schedule your first prenatal care visit as soon as you know that you are pregnant. Your first visit is a good time to talk about any questions or concerns you have about pregnancy. Medical history At your visit, you and your health care provider will talk about your medical history, including:  Any past pregnancies.  Your family's medical history.  Medical history of the baby's father.  Any long-term (chronic) health conditions you have and how you manage them.  Any surgeries or procedures you have had.  Any current over-the-counter or prescription medicines, herbs, or supplements that you are taking.  Other factors that could pose a risk to your baby, including: ? Exposure to harmful chemicals or radiation at work or at home. ? Any substance use, including tobacco, alcohol, and drug use.  Your home setting and your stress levels, including: ? Exposure to  abuse or violence. ? Household financial strain.  Your daily health habits, including diet and exercise. Tests and screenings Your health care provider will:  Measure your weight, height, and blood pressure.  Do a physical exam, including a pelvic and breast exam.  Perform blood tests and urine tests to check for: ? Urinary tract infection. ? Sexually transmitted infections (STIs). ? Low iron levels in your blood (anemia). ? Blood type and certain proteins on red blood cells (Rh antibodies). ? Infections and immunity to viruses, such as hepatitis B and rubella. ? HIV (human immunodeficiency virus).  Discuss your options for genetic screening. Tips about staying healthy Your health care provider will also give you information about how to keep yourself and your baby healthy, including:  Nutrition and taking vitamins.  Physical activity.  How to manage pregnancy symptoms such as nausea and vomiting (morning sickness).  Infections and substances that may be harmful to your baby and how to avoid them.  Food safety.  Dental care.  Working.  Travel.  Warning signs to watch for and when to call your health care provider. How often will I have prenatal care visits? After your first prenatal care visit, you will have regular visits throughout your pregnancy. The visit schedule is often as follows:  Up to week 28 of pregnancy: once every 4 weeks.  28-36 weeks: once every 2 weeks.  After 36 weeks: every week until delivery. Some women may have visits more or less often depending on any underlying health conditions and the health of the baby. Keep all follow-up and prenatal care visits. This is important. What happens during routine prenatal care visits? Your health care provider will:  Measure your weight   and blood pressure.  Check for fetal heart sounds.  Measure the height of your uterus in your abdomen (fundal height). This may be measured starting around week 20 of  pregnancy.  Check the position of your baby inside your uterus.  Ask questions about your diet, sleeping patterns, and whether you can feel the baby move.  Review warning signs to watch for and signs of labor.  Ask about any pregnancy symptoms you are having and how you are dealing with them. Symptoms may include: ? Headaches. ? Nausea and vomiting. ? Vaginal discharge. ? Swelling. ? Fatigue. ? Constipation. ? Changes in your vision. ? Feeling persistently sad or anxious. ? Any discomfort, including back or pelvic pain. ? Bleeding or spotting. Make a list of questions to ask your health care provider at your routine visits.   What tests might I have during prenatal care visits? You may have blood, urine, and imaging tests throughout your pregnancy, such as:  Urine tests to check for glucose, protein, or signs of infection.  Glucose tests to check for a form of diabetes that can develop during pregnancy (gestational diabetes mellitus). This is usually done around week 24 of pregnancy.  Ultrasounds to check your baby's growth and development, to check for birth defects, and to check your baby's well-being. These can also help to decide when you should deliver your baby.  A test to check for group B strep (GBS) infection. This is usually done around week 36 of pregnancy.  Genetic testing. This may include blood, fluid, or tissue sampling, or imaging tests, such as an ultrasound. Some genetic tests are done during the first trimester and some are done during the second trimester. What else can I expect during prenatal care visits? Your health care provider may recommend getting certain vaccines during pregnancy. These may include:  A yearly flu shot (annual influenza vaccine). This is especially important if you will be pregnant during flu season.  Tdap (tetanus, diphtheria, pertussis) vaccine. Getting this vaccine during pregnancy can protect your baby from whooping cough  (pertussis) after birth. This vaccine may be recommended between weeks 27 and 36 of pregnancy.  A COVID-19 vaccine. Later in your pregnancy, your health care provider may give you information about:  Childbirth and breastfeeding classes.  Choosing a health care provider for your baby.  Umbilical cord banking.  Breastfeeding.  Birth control after your baby is born.  The hospital labor and delivery unit and how to set up a tour.  Registering at the hospital before you go into labor. Where to find more information  Office on Women's Health: womenshealth.gov  American Pregnancy Association: americanpregnancy.org  March of Dimes: marchofdimes.org Summary  Prenatal care helps you and your baby stay as healthy as possible during pregnancy.  Your first prenatal care visit will most likely be the longest.  You will have visits and tests throughout your pregnancy to monitor your health and your baby's health.  Bring a list of questions to your visits to ask your health care provider.  Make sure to keep all follow-up and prenatal care visits. This information is not intended to replace advice given to you by your health care provider. Make sure you discuss any questions you have with your health care provider. Document Revised: 08/22/2020 Document Reviewed: 08/22/2020 Elsevier Patient Education  2021 [employer] Inc.  

## 2020-12-28 ENCOUNTER — Encounter: Payer: Self-pay | Admitting: Emergency Medicine

## 2020-12-28 ENCOUNTER — Emergency Department
Admission: EM | Admit: 2020-12-28 | Discharge: 2020-12-28 | Disposition: A | Discharge status: Home / Self Care | Payer: 59 | Attending: Emergency Medicine | Admitting: Emergency Medicine

## 2020-12-28 ENCOUNTER — Emergency Department: Payer: 59

## 2020-12-28 DIAGNOSIS — E039 Hypothyroidism, unspecified: Secondary | ICD-10-CM | POA: Diagnosis not present

## 2020-12-28 DIAGNOSIS — O26891 Other specified pregnancy related conditions, first trimester: Secondary | ICD-10-CM | POA: Diagnosis present

## 2020-12-28 DIAGNOSIS — O039 Complete or unspecified spontaneous abortion without complication: Secondary | ICD-10-CM | POA: Diagnosis not present

## 2020-12-28 DIAGNOSIS — Z79899 Other long term (current) drug therapy: Secondary | ICD-10-CM | POA: Insufficient documentation

## 2020-12-28 DIAGNOSIS — Z3A00 Weeks of gestation of pregnancy not specified: Secondary | ICD-10-CM | POA: Diagnosis not present

## 2020-12-28 DIAGNOSIS — O99281 Endocrine, nutritional and metabolic diseases complicating pregnancy, first trimester: Secondary | ICD-10-CM | POA: Diagnosis not present

## 2020-12-28 DIAGNOSIS — R9431 Abnormal electrocardiogram [ECG] [EKG]: Secondary | ICD-10-CM | POA: Diagnosis not present

## 2020-12-28 DIAGNOSIS — Z3A01 Less than 8 weeks gestation of pregnancy: Secondary | ICD-10-CM | POA: Diagnosis not present

## 2020-12-28 DIAGNOSIS — Z3689 Encounter for other specified antenatal screening: Secondary | ICD-10-CM | POA: Diagnosis not present

## 2020-12-28 DIAGNOSIS — O209 Hemorrhage in early pregnancy, unspecified: Secondary | ICD-10-CM

## 2020-12-28 LAB — CBC
HCT: 37.7 % (ref 36.0–46.0)
Hemoglobin: 12.7 g/dL (ref 12.0–15.0)
MCH: 30.3 pg (ref 26.0–34.0)
MCHC: 33.7 g/dL (ref 30.0–36.0)
MCV: 90 fL (ref 80.0–100.0)
Platelets: 239 10*3/uL (ref 150–400)
RBC: 4.19 MIL/uL (ref 3.87–5.11)
RDW: 13.2 % (ref 11.5–15.5)
WBC: 10.8 10*3/uL — ABNORMAL HIGH (ref 4.0–10.5)
nRBC: 0 % (ref 0.0–0.2)

## 2020-12-28 LAB — COMPREHENSIVE METABOLIC PANEL
ALT: 15 U/L (ref 0–44)
AST: 17 U/L (ref 15–41)
Albumin: 4 g/dL (ref 3.5–5.0)
Alkaline Phosphatase: 66 U/L (ref 38–126)
Anion gap: 7 (ref 5–15)
BUN: 11 mg/dL (ref 6–20)
CO2: 22 mmol/L (ref 22–32)
Calcium: 9.1 mg/dL (ref 8.9–10.3)
Chloride: 105 mmol/L (ref 98–111)
Creatinine, Ser: 0.63 mg/dL (ref 0.44–1.00)
GFR, Estimated: >60 mL/min (ref >60)
Glucose, Bld: 103 mg/dL — ABNORMAL HIGH (ref 70–99)
Potassium: 3.7 mmol/L (ref 3.5–5.1)
Sodium: 134 mmol/L — ABNORMAL LOW (ref 135–145)
Total Bilirubin: 0.5 mg/dL (ref 0.3–1.2)
Total Protein: 7.9 g/dL (ref 6.5–8.1)

## 2020-12-28 LAB — HCG, QUANTITATIVE, PREGNANCY: hCG, Beta Chain, Quant, S: 1861 m[IU]/mL — ABNORMAL HIGH (ref <5)

## 2020-12-28 LAB — ABO/RH: ABO/RH(D): A POS

## 2020-12-28 NOTE — ED Triage Notes (Signed)
Pt reports she is [redacted] weeks pregnant and today she started to having spotting at 1330 and this evening at 1700, bleeding worsened with clots. Pt advised to come to ED by OB for possible miscarriage. pts second pregnancy, no miscarriage hx.

## 2020-12-28 NOTE — ED Notes (Signed)
Patient discharged home with family member. All required discharge information given and explained to patient at discharge. Patient ambulated off unit without incident.

## 2020-12-28 NOTE — ED Provider Notes (Signed)
[facility] Emergency Department Provider Note  ____________________________________________   Event Date/Time   First MD Initiated Contact with Patient 12/28/20 2122     (approximate)  I have reviewed the triage vital signs and the nursing notes.   HISTORY  Chief Complaint Vaginal Bleeding   HPI Julie Rowe is a 30 y.o. female with past medical history of hypothyroidism, COVID-19 diagnosis 2 weeks ago, and currently approximately [redacted] weeks pregnant by last LMP who presents for assessment of pelvic cramping she experienced earlier this afternoon associate with passage of some blood clots.  Patient states she still has a little bit of bleeding but this is eased off in her pelvic cramping pain is also resolved.  She denies any recent injuries or falls, headache, earache, sore throat, chest pain, cough, shortness of breath, back pain, rash or extremity pain.  She denies any recent urinary symptoms including burning, frequency or other abnormal vaginal discharge.  No other acute concerns at this time.  States he was told to come to ED for possible miscarriage by her OB.         Past Medical History:  Diagnosis Date  . Hypothyroid   . Thyroid disease     Patient Active Problem List   Diagnosis Date Noted  . Hashimoto's thyroiditis 12/31/2016  . Dysmenorrhea 08/13/2016  . Hypothyroidism 08/13/2016  . Hypothyroidism due to Hashimoto's thyroiditis 10/21/2015    Past Surgical History:  Procedure Laterality Date  . TONSILLECTOMY    . TONSILLECTOMY AND ADENOIDECTOMY      Prior to Admission medications   Medication Sig Start Date End Date Taking? Authorizing Provider  Iron-FA-B Cmp-C-Biot-Probiotic (FUSION PLUS) CAPS Take 1 tablet by mouth daily. Patient not taking: Reported on 08/08/2019 06/11/19   Doreene Burke, CNM  levothyroxine (SYNTHROID) 88 MCG tablet TAKE 1 TABLET (88 MCG TOTAL) BY MOUTH DAILY BEFORE BREAKFAST. 12/11/20   Doreene Burke, CNM   Prenatal Vit-Fe Fumarate-FA (PRENATAL MULTIVITAMIN) TABS tablet Take 1 tablet by mouth daily at 12 noon.    [provider]  SYNTHROID 88 MCG tablet TAKE 1 TABLET BY MOUTH EVERY DAY 05/23/20   Doreene Burke, CNM    Allergies Patient has no known allergies.  Family History  Problem Relation Age of Onset  . Diabetes Mother   . Thyroid disease Mother   . Diabetes Brother   . Thyroid disease Brother   . Diabetes Maternal Grandmother   . Thyroid disease Paternal Grandmother     Social History Social History   Tobacco Use  . Smoking status: Never Smoker  . Smokeless tobacco: Never Used  Vaping Use  . Vaping Use: Never used  Substance Use Topics  . Alcohol use: No  . Drug use: No    Review of Systems  Review of Systems  Constitutional: Negative for chills and fever.  HENT: Negative for sore throat.   Eyes: Negative for pain.  Respiratory: Negative for cough and stridor.   Cardiovascular: Negative for chest pain.  Gastrointestinal: Positive for abdominal pain ( earlier today, now improved). Negative for vomiting.  Genitourinary: Negative for dysuria and frequency.  Musculoskeletal: Negative for myalgias.  Skin: Negative for rash.  Neurological: Negative for seizures, loss of consciousness and headaches.  Psychiatric/Behavioral: Negative for suicidal ideas.  All other systems reviewed and are negative.     ____________________________________________   PHYSICAL EXAM:  VITAL SIGNS: ED Triage Vitals  Enc Vitals Group     BP 12/28/20 1943 (!) 147/113     Pulse  Rate 12/28/20 1943 (!) 115     Resp 12/28/20 1943 18     Temp 12/28/20 1943 98.6 F (37 C)     Temp Source 12/28/20 1943 Oral     SpO2 12/28/20 1943 99 %     Weight 12/28/20 1942 220 lb (99.8 kg)     Height 12/28/20 1942 5\' 6"  (1.676 m)     Head Circumference --      Peak Flow --      Pain Score --      Pain Loc --      Pain Edu? --      Excl. in GC? --    Vitals:   12/28/20 1943 12/28/20  2200  BP: (!) 147/113 127/88  Pulse: (!) 115 87  Resp: 18 16  Temp: 98.6 F (37 C)   SpO2: 99% 98%   Physical Exam Vitals and nursing note reviewed.  Constitutional:      General: She is not in acute distress.    Appearance: She is well-developed and well-nourished.  HENT:     Head: Normocephalic and atraumatic.     Right Ear: External ear normal.     Left Ear: External ear normal.     Nose: Nose normal.     Mouth/Throat:     Mouth: Mucous membranes are moist.  Eyes:     Conjunctiva/sclera: Conjunctivae normal.  Cardiovascular:     Rate and Rhythm: Normal rate and regular rhythm.     Heart sounds: No murmur heard.   Pulmonary:     Effort: Pulmonary effort is normal. No respiratory distress.     Breath sounds: Normal breath sounds.  Abdominal:     Palpations: Abdomen is soft.     Tenderness: There is no abdominal tenderness. There is no right CVA tenderness or left CVA tenderness.  Musculoskeletal:        General: No edema.     Cervical back: Neck supple.  Skin:    General: Skin is warm and dry.  Neurological:     Mental Status: She is alert and oriented to person, place, and time.  Psychiatric:        Mood and Affect: Mood and affect and mood normal. Affect is tearful.      ____________________________________________   LABS (all labs ordered are listed, but only abnormal results are displayed)  Labs Reviewed  CBC - Abnormal; Notable for the following components:      Result Value   WBC 10.8 (*)    All other components within normal limits  HCG, QUANTITATIVE, PREGNANCY - Abnormal; Notable for the following components:   hCG, Beta Chain, Quant, S 1,861 (*)    All other components within normal limits  COMPREHENSIVE METABOLIC PANEL - Abnormal; Notable for the following components:   Sodium 134 (*)    Glucose, Bld 103 (*)    All other components within normal limits  ABO/RH    ____________________________________________  EKG  ____________________________________________  RADIOLOGY  ED MD interpretation: No evidence of IUP or ectopic pregnancy.  Thickened endometrium suggestive of spontaneous abortion with possible retained products.  Official radiology report(s): 11-27-2005 OB LESS THAN 14 WEEKS WITH OB TRANSVAGINAL  Result Date: 12/28/2020 CLINICAL DATA:  Body Ng. Last menstrual period 11/03/2020, gestational age by last menstrual period 7 weeks and 6 days. Estimated due date by last menstrual period 08/10/2021 EXAM: OBSTETRIC <14 WK 08/12/2021 AND TRANSVAGINAL OB US TECHNIQUE: Both transabdominal and transvaginal ultrasound examinations were performed for complete evaluation of  the gestation as well as the maternal uterus, adnexal regions, and pelvic cul-de-sac. Transvaginal technique was performed to assess early pregnancy. COMPARISON:  None. FINDINGS: Intrauterine gestational sac: None Yolk sac:  Not Visualized. Embryo:  Not Visualized. Cardiac Activity: Not Visualized. Subchorionic hemorrhage:  None visualized. Maternal uterus/adnexae: The endometrium is thickened measuring up to 19 mm. It is noted to be heterogeneous and hypervascular. Bilateral ovaries are unremarkable. The left ovary measures 1.9 x 2.8 x 1.3 cm. The right ovary measures 1.6 x 2 x 1.5 cm. IMPRESSION: No visualized intrauterine or ectopic pregnancy. Thickened, heterogeneous, and vascular endometrium (19 mm). Differential diagnosis includes spontaneous abortion with retained products of conception versus malignancy such as choriocarcinoma. Recommend correlation with beta HCG, trending of beta hCG to 0, and close OB gyn follow-up. These results were called by telephone at the time of interpretation on 12/28/2020 at 9:14 pm to provider [facility] , who verbally acknowledged these results. Electronically Signed   By: Tish Frederickson M.D.   On: 12/28/2020 21:15     ____________________________________________   PROCEDURES  Procedure(s) performed (including Critical Care):  .1-3 Lead EKG Interpretation Performed by: Gilles Chiquito, MD Authorized by: Gilles Chiquito, MD     Interpretation: normal     ECG rate assessment: normal     Rhythm: sinus rhythm     Ectopy: none     Conduction: normal       ____________________________________________   INITIAL IMPRESSION / ASSESSMENT AND PLAN / ED COURSE      Patient presents with above to history exam for assessment after she had some passage of blood products and some lower crampy abdominal pain earlier today in the setting of being approximately [redacted] weeks pregnant.  On arrival she has slight tachycardic with otherwise stable vital signs on room air.  Her abdomen is soft she is not pruritic and she has no CVA tenderness.  Ultrasound obtained in triage does not show evidence of an IUP or ectopic pregnancy however there is thickening of the endometrium.  hCG of 1861 is consistent with approximately 8-week pregnancy and likely miscarriage given absence of any visualized IUP or ectopic.  CMP shows no significant lecture metabolic derangements.  CBC shows no significant derangements.  No historical or exam findings to suggest pyelonephritis, cystitis, traumatic injury, or acute infectious process.  It is certainly possible there is some still retained products and I discussed with patient expected clinical course and possibility for passage of some additional clots and some bleeding the next couple days.  Patient is Rh+ and there is indication for RhoGam at this time.  Given stable vitals and hemoglobin at baseline with no pelvic pain on arrival and patient stating her bleeding had significantly slowed and believe she is safe for discharge with plan for outpatient OB/GYN follow-up.  Discussed plan expected course with patient and partner at bedside.  Discussed return precautions including development  of any lightheadedness, dizziness, chest pain, shortness of breath, fever, abnormal vaginal discharge, or any other acute pain.  Both patient and partner voiced understanding agreement with plan to return to emergency room if they develop any of the symptoms.   ____________________________________________   FINAL CLINICAL IMPRESSION(S) / ED DIAGNOSES  Final diagnoses:  Miscarriage    Medications - No data to display   ED Discharge Orders    None       Note:  This document was prepared using Dragon voice recognition software and may include unintentional dictation errors.   Antoine Primas  P, MD 12/28/20 2332

## 2020-12-28 NOTE — Discharge Instructions (Addendum)
Please return emergently to the emergency room if you experience any lightheadedness, dizziness, chest pain, shortness of breath, significant increase in bleeding or any other acute pain.

## 2020-12-29 ENCOUNTER — Other Ambulatory Visit: Payer: Self-pay | Admitting: Certified Nurse Midwife

## 2020-12-29 DIAGNOSIS — O021 Missed abortion: Secondary | ICD-10-CM

## 2020-12-30 ENCOUNTER — Telehealth: Payer: Self-pay

## 2020-12-30 NOTE — Telephone Encounter (Signed)
Spoke with patient- she states she is doing ok. States her bleeding has picked up a little. States she is resting today. Patient has an appointment tomorrow 12/31/20 with Doreene Burke CNM. Let patient know she would get another beta quant level tomorrow at her visit. Patient was encouraged to reach out to [place] if anything changes before her appointment. Patient was in agreement.

## 2020-12-30 NOTE — Telephone Encounter (Signed)
Pt called in and stated that she went the [facility] ED over the weekend. The pt was added to see AT schedule for  tomorrow,  The pt denies any pain other then just cramps. I told the pt I will send a message to the nurse. If they want to see you before then. The pt verbally understood. Please advise

## 2020-12-31 ENCOUNTER — Ambulatory Visit: Payer: 59 | Admitting: Certified Nurse Midwife

## 2020-12-31 ENCOUNTER — Ambulatory Visit (INDEPENDENT_AMBULATORY_CARE_PROVIDER_SITE_OTHER): Payer: 59 | Admitting: Certified Nurse Midwife

## 2020-12-31 ENCOUNTER — Encounter: Payer: Self-pay | Admitting: Certified Nurse Midwife

## 2020-12-31 ENCOUNTER — Other Ambulatory Visit: Payer: Self-pay

## 2020-12-31 DIAGNOSIS — O021 Missed abortion: Secondary | ICD-10-CM | POA: Diagnosis not present

## 2020-12-31 NOTE — Progress Notes (Signed)
GYN ENCOUNTER NOTE  Subjective:       Julie Rowe is a 30 y.o. G70P1001 female is here for gynecologic evaluation of the following issues:  1. Seen today follow up from ED 12/28/20, Missed abortion  Approximately @  7-8 wks.  She presents today due to concern of continued "heavy bleeding & passing clots". She state the clots are the size of quarter. She is changing her pad every few hrs. states she is not saturating pad every hr.      Obstetric History OB History  Gravida Para Term Preterm AB Living  2 1 1  0 0 1  SAB IAB Ectopic Multiple Live Births  0 0 0 0 1    # Outcome Date GA Lbr Len/2nd Weight Sex Delivery Anes PTL Lv  2 Current           1 Term 06/20/19 [redacted]w[redacted]d 11:17 / 03:13 8 lb 8.9 oz (3.88 kg) M Vag-Spont EPI  LIV    Past Medical History:  Diagnosis Date  . Hypothyroid   . Thyroid disease     Past Surgical History:  Procedure Laterality Date  . TONSILLECTOMY    . TONSILLECTOMY AND ADENOIDECTOMY      Current Outpatient Medications on File Prior to Visit  Medication Sig Dispense Refill  . levothyroxine (SYNTHROID) 88 MCG tablet TAKE 1 TABLET (88 MCG TOTAL) BY MOUTH DAILY BEFORE BREAKFAST. 90 tablet 1  . Prenatal Vit-Fe Fumarate-FA (PRENATAL MULTIVITAMIN) TABS tablet Take 1 tablet by mouth daily at 12 noon.    . Iron-FA-B Cmp-C-Biot-Probiotic (FUSION PLUS) CAPS Take 1 tablet by mouth daily. (Patient not taking: Reported on 08/08/2019) 30 capsule 4  . SYNTHROID 88 MCG tablet TAKE 1 TABLET BY MOUTH EVERY DAY 90 tablet 0   No current facility-administered medications on file prior to visit.    No Known Allergies  Social History   Socioeconomic History  . Marital status: Married    Spouse name: 08/10/2019   . Number of children: Not on file  . Years of education: Not on file  . Highest education level: Not on file  Occupational History  . Not on file  Tobacco Use  . Smoking status: Never Smoker  . Smokeless tobacco: Never Used  Vaping Use  . Vaping Use: Never  used  Substance and Sexual Activity  . Alcohol use: No  . Drug use: No  . Sexual activity: Yes    Birth control/protection: None    Comment: Natural family planning  Other Topics Concern  . Not on file  Social History Narrative  . Not on file   Social Determinants of Health   Financial Resource Strain: Not on file  Food Insecurity: Not on file  Transportation Needs: Not on file  Physical Activity: Not on file  Stress: Not on file  Social Connections: Not on file  Intimate Partner Violence: Not on file    Family History  Problem Relation Age of Onset  . Diabetes Mother   . Thyroid disease Mother   . Diabetes Brother   . Thyroid disease Brother   . Diabetes Maternal Grandmother   . Thyroid disease Paternal Grandmother     The following portions of the patient's history were reviewed and updated as appropriate: allergies, current medications, past family history, past medical history, past social history, past surgical history and problem list.  Review of Systems Review of Systems - Negative except as mentioned in HPI Review of Systems - General ROS: negative for - chills, fatigue,  fever, hot flashes, malaise or night sweats Hematological and Lymphatic ROS: negative for - bleeding problems or swollen lymph nodes Gastrointestinal ROS: negative for - abdominal pain, blood in stools, change in bowel habits and nausea/vomiting Musculoskeletal ROS: negative for - joint pain, muscle pain or muscular weakness Genito-Urinary ROS: negative for - change in menstrual cycle, dysmenorrhea, dyspareunia, dysuria, genital discharge, genital ulcers, hematuria, incontinence, irregular/heavy menses, nocturia or pelvic pain  Objective:   LMP 11/03/2020 (Exact Date)  CONSTITUTIONAL: Well-developed, well-nourished female in no acute distress.  HENT:  Normocephalic, atraumatic.  NECK: Normal range of motion, supple, no masses.  Normal thyroid.  SKIN: Skin is warm and dry. No rash noted. Not  diaphoretic. No erythema. No pallor. NEUROLGIC: Alert and oriented to person, place, and time. PSYCHIATRIC: Normal mood and affect. Normal behavior. Normal judgment and thought content. CARDIOVASCULAR:RRR RESPIRATORY: , pink, no signs respiratory distress BREASTS: Not Examined ABDOMEN: Soft, non distended; Non tender.  No Organomegaly. PELVIC: not indicated MUSCULOSKELETAL: Normal range of motion. No tenderness.  No cyanosis, clubbing, or edema.  U/s: 12/28/20 FINDINGS: Intrauterine gestational sac: None  Yolk sac:  Not Visualized.  Embryo:  Not Visualized.  Cardiac Activity: Not Visualized.  Subchorionic hemorrhage:  None visualized.  Maternal uterus/adnexae: The endometrium is thickened measuring up to 19 mm. It is noted to be heterogeneous and hypervascular. Bilateral ovaries are unremarkable. The left ovary measures 1.9 x 2.8 x 1.3 cm. The right ovary measures 1.6 x 2 x 1.5 cm.  IMPRESSION: No visualized intrauterine or ectopic pregnancy.  Thickened, heterogeneous, and vascular endometrium (19 mm). Differential diagnosis includes spontaneous abortion with retained products of conception versus malignancy such as choriocarcinoma. Recommend correlation with beta HCG, trending of beta hCG to 0, and close OB gyn follow-up.  These results were called by telephone at the time of interpretation on 12/28/2020 at 9:14 pm to provider [facility] , who verbally acknowledged these results.   Electronically Signed   By: Tish Frederickson M.D.   On: 12/28/2020 21:15    Assessment:   Missed abortion    Plan:   Beta quant level repeated today, CBC to evaluate blood loss.Will continue to monitor beta HCG until return to non pregnant level. Pt has u/s scheduled for tomorrow was supposed to be dating, encouraged pt to keep u/s will evaluated u/s for retained tissue.  Discussed vaginal bleeding and warning sign. Will follow up with lab results and u/s result Will inform  pt of date to return for repeat beta based off of today's results. She verbalizes and agrees to plan.   Doreene Burke, CNM

## 2020-12-31 NOTE — Patient Instructions (Signed)
Miscarriage A miscarriage is the loss of a pregnancy before the 20th week of pregnancy. Sometimes, a pregnancy ends before a woman knows that she is pregnant. If you lose a pregnancy, talk with your doctor about:  Questions you have about the loss of your baby.  How to work through your grief.  Plans for future pregnancy. What are the causes? Many times, the cause of this condition is not known. What increases the risk? These things may make a pregnant woman more likely to lose a pregnancy: Certain health conditions  Conditions that affect hormones, such as: ? Thyroid disease. ? Polycystic ovary syndrome.  Diabetes.  A disease that causes the body's disease-fighting system to attack itself by mistake.  Infections.  Bleeding problems.  Being very overweight. Lifestyle factors  Using products that have tobacco or nicotine in them.  Being around tobacco smoke.  Having alcohol.  Having a lot of caffeine.  Using drugs. Problems with reproductive organs or parts  Having a cervix that opens and thins before your due date. The cervix is the lowest part of your womb.  Having Asherman syndrome, which leads to: ? Scars in the womb. ? The womb being abnormal in shape.  Growths (fibroids) in the womb.  Problems in the body that are present at birth.  Infection of the cervix or womb. Personal or health history  Injury.  Having lost a pregnancy before.  Being younger than age 18 or older than age 35.  Being around a harmful substance, such as radiation.  Having lead or other heavy metals in: ? Things you eat or drink. ? The air around you.  Using certain medicines. What are the signs or symptoms?  Blood or spots of blood coming from the vagina. You may also have cramps or pain.  Pain or cramps in the belly or low back.  Fluid or tissue coming out of the vagina. How is this treated? Sometimes, treatment is not needed. If you need treatment, you may be  treated with:  A procedure to open the cervix more and take tissue out of the womb.  Medicines. You may get a shot of medicine called Rho(D) immune globulin. Follow these instructions at home: Medicines  Take over-the-counter and prescription medicines only as told by your doctor.  If you were prescribed antibiotic medicine, take it as told by your doctor. Do not stop taking it even if you start to feel better. Activity  Rest as told by your doctor. Ask your doctor what activities are safe for you.  Have someone help you at home during this time. General instructions  Watch how much tissue comes out of the vagina.  Watch the size of any blood clots that come out of the vagina.  Do not have sex or douche until your doctor says it is okay.  Do not put things, such as tampons, in your vagina until your doctor says it is okay.  To help you and your partner with grieving: ? Talk with your doctor. ? See a counselor.  When you are ready, talk with your doctor about: ? Things to do for your health. ? How you can be healthy if you get pregnant again.  Keep all follow-up visits.   Where to find more information  The American College of Obstetricians and Gynecologists: acog.org  [place] Department of Health and Human Services Office of Women's Health: hrsa.gov/office-womens-health Contact a doctor if:  You have a fever or chills.  There is bad-smelling fluid coming   from the vagina.  You have more bleeding.  Tissue or clots of blood come out of your vagina. Get help right away if:  You have very bad cramps or pain in your back or belly.  You soak more than 2 large pads in an hour for more than 2 hours.  You get light-headed or weak.  You faint.  You feel sad, and you have sad thoughts a lot of the time.  You think about hurting yourself. Get help right awayif you feel like you may hurt yourself or others, or have thoughts about taking your own life. Go to your nearest  emergency room or:  Call your local emergency services (911 in the [place]  Call the National Suicide Prevention Lifeline at 1-800-273-8255. This is open 24 hours a day.  Text the Crisis Text Line at 741741. Summary  A miscarriage is the loss of a pregnancy before the 20th week of pregnancy. Sometimes, a pregnancy ends before a woman knows that she is pregnant.  Follow instructions from your doctor about medicines and activity.  To help you and your partner with grieving, talk with your doctor or a counselor.  Keep all follow-up visits. This information is not intended to replace advice given to you by your health care provider. Make sure you discuss any questions you have with your health care provider. Document Revised: 05/10/2020 Document Reviewed: 05/10/2020 Elsevier Patient Education  2021 [employer]  

## 2020-12-31 NOTE — Progress Notes (Signed)
See previous note from today.

## 2021-01-01 ENCOUNTER — Encounter: Payer: 59 | Admitting: Certified Nurse Midwife

## 2021-01-01 ENCOUNTER — Other Ambulatory Visit: Payer: Self-pay | Admitting: Certified Nurse Midwife

## 2021-01-01 ENCOUNTER — Ambulatory Visit (INDEPENDENT_AMBULATORY_CARE_PROVIDER_SITE_OTHER): Payer: 59

## 2021-01-01 DIAGNOSIS — N926 Irregular menstruation, unspecified: Secondary | ICD-10-CM

## 2021-01-01 DIAGNOSIS — O021 Missed abortion: Secondary | ICD-10-CM

## 2021-01-01 LAB — CBC
Hematocrit: 40.7 % (ref 34.0–46.6)
Hemoglobin: 13.4 g/dL (ref 11.1–15.9)
MCH: 30.5 pg (ref 26.6–33.0)
MCHC: 32.9 g/dL (ref 31.5–35.7)
MCV: 93 fL (ref 79–97)
Platelets: 245 10*3/uL (ref 150–450)
RBC: 4.4 x10E6/uL (ref 3.77–5.28)
RDW: 12.5 % (ref 11.7–15.4)
WBC: 7.6 10*3/uL (ref 3.4–10.8)

## 2021-01-01 LAB — BETA HCG QUANT (REF LAB): hCG Quant: 125 m[IU]/mL

## 2021-01-07 ENCOUNTER — Other Ambulatory Visit: Payer: 59

## 2021-01-07 ENCOUNTER — Other Ambulatory Visit: Payer: Self-pay

## 2021-01-07 DIAGNOSIS — O021 Missed abortion: Secondary | ICD-10-CM | POA: Diagnosis not present

## 2021-01-08 LAB — BETA HCG QUANT (REF LAB): hCG Quant: 4 m[IU]/mL

## 2021-01-21 ENCOUNTER — Other Ambulatory Visit (HOSPITAL_COMMUNITY)
Admission: RE | Admit: 2021-01-21 | Discharge: 2021-01-21 | Disposition: A | Discharge status: Home / Self Care | Payer: 59 | Source: Ambulatory Visit | Attending: Certified Nurse Midwife | Admitting: Certified Nurse Midwife

## 2021-01-21 ENCOUNTER — Ambulatory Visit (INDEPENDENT_AMBULATORY_CARE_PROVIDER_SITE_OTHER): Payer: 59 | Admitting: Certified Nurse Midwife

## 2021-01-21 ENCOUNTER — Other Ambulatory Visit: Payer: Self-pay

## 2021-01-21 ENCOUNTER — Encounter: Payer: Self-pay | Admitting: Certified Nurse Midwife

## 2021-01-21 VITALS — BP 114/81 | HR 75 | Ht 66.0 in | Wt 221.3 lb

## 2021-01-21 DIAGNOSIS — Z124 Encounter for screening for malignant neoplasm of cervix: Secondary | ICD-10-CM | POA: Diagnosis not present

## 2021-01-21 DIAGNOSIS — Z01419 Encounter for gynecological examination (general) (routine) without abnormal findings: Secondary | ICD-10-CM | POA: Insufficient documentation

## 2021-01-21 NOTE — Progress Notes (Signed)
GYNECOLOGY ANNUAL PREVENTATIVE CARE ENCOUNTER NOTE  History:     Julie Rowe is a 30 y.o. G64P1011 female here for a routine annual gynecologic exam.  Current complaints: none, has not had a regular cycle since her miscarriage at the beginning of the [facility].   Denies abnormal vaginal bleeding, discharge, pelvic pain, problems with intercourse or other gynecologic concerns.     Social Relationship: married  Living: husband and son Work:[employer] Exercise: 2-3 x wkly for 20 min. Smoke/Alcohol/drug GYI:RSWNIO   Gynecologic History No LMP recorded. Contraception: none, planning pregnancy Last Pap: 12/31/2017. Results were: normal Last mammogram: n/a    Upstream - 01/21/21 1028      Pregnancy Intention Screening   Does the patient want to become pregnant in the next year? Yes    Does the patient's partner want to become pregnant in the next year? Yes    Would the patient like to discuss contraceptive options today? No          The pregnancy intention screening data noted above was reviewed. Potential methods of contraception were discussed. The patient elected to proceed with Pregnant/Seeking Pregnancy.   Obstetric History OB History  Gravida Para Term Preterm AB Living  2 1 1  0 1 1  SAB IAB Ectopic Multiple Live Births  1 0 0 0 1    # Outcome Date GA Lbr Len/2nd Weight Sex Delivery Anes PTL Lv  2 SAB 01/07/21 [redacted]w[redacted]d         1 Term 06/20/19 [redacted]w[redacted]d 11:17 / 03:13 8 lb 8.9 oz (3.88 kg) M Vag-Spont EPI  LIV    Past Medical History:  Diagnosis Date  . Hypothyroid   . Thyroid disease     Past Surgical History:  Procedure Laterality Date  . TONSILLECTOMY    . TONSILLECTOMY AND ADENOIDECTOMY      Current Outpatient Medications on File Prior to Visit  Medication Sig Dispense Refill  . levothyroxine (SYNTHROID) 88 MCG tablet TAKE 1 TABLET (88 MCG TOTAL) BY MOUTH DAILY BEFORE BREAKFAST. 90 tablet 1  . Prenatal Vit-Fe Fumarate-FA (PRENATAL MULTIVITAMIN) TABS  tablet Take 1 tablet by mouth daily at 12 noon.     No current facility-administered medications on file prior to visit.    No Known Allergies  Social History:  reports that she has never smoked. She has never used smokeless tobacco. She reports that she does not drink alcohol and does not use drugs.  Family History  Problem Relation Age of Onset  . Diabetes Mother   . Thyroid disease Mother   . Diabetes Brother   . Thyroid disease Brother   . Diabetes Maternal Grandmother   . Thyroid disease Paternal Grandmother   . Breast cancer Neg Hx   . Ovarian cancer Neg Hx   . Colon cancer Neg Hx     The following portions of the patient's history were reviewed and updated as appropriate: allergies, current medications, past family history, past medical history, past social history, past surgical history and problem list.  Review of Systems Pertinent items noted in HPI and remainder of comprehensive ROS otherwise negative.  Physical Exam:  BP 114/81   Pulse 75   Ht 5\' 6"  (1.676 m)   Wt 221 lb 4.8 oz (100.4 kg)   BMI 35.72 kg/m  CONSTITUTIONAL: Well-developed, well-nourished female in no acute distress.  HENT:  Normocephalic, atraumatic, External right and left ear normal. Oropharynx is clear and moist EYES: Conjunctivae and EOM are normal. Pupils are  equal, round, and reactive to light. No scleral icterus.  NECK: Normal range of motion, supple, no masses.  Normal thyroid.  SKIN: Skin is warm and dry. No rash noted. Not diaphoretic. No erythema. No pallor. MUSCULOSKELETAL: Normal range of motion. No tenderness.  No cyanosis, clubbing, or edema.  2+ distal pulses. NEUROLOGIC: Alert and oriented to person, place, and time. Normal reflexes, muscle tone coordination.  PSYCHIATRIC: Normal mood and affect. Normal behavior. Normal judgment and thought content. CARDIOVASCULAR: Normal heart rate noted, regular rhythm RESPIRATORY: Clear to auscultation bilaterally. Effort and breath sounds  normal, no problems with respiration noted. BREASTS: Symmetric in size. No masses, tenderness, skin changes, nipple drainage, or lymphadenopathy bilaterally.  ABDOMEN: Soft, no distention noted.  No tenderness, rebound or guarding.  PELVIC: Normal appearing external genitalia and urethral meatus; normal appearing vaginal mucosa and cervix.  No abnormal discharge noted.  Pap smear obtained. Contact bleeding present. Normal uterine size, no other palpable masses, no uterine or adnexal tenderness.  .   Assessment and Plan:    1. Women's annual routine gynecological examination  . Pap: Will follow up results of pap smear and manage accordingly Mammogram : n/a  Labs: none  Refills: none Referral: none Reassurance regarding period. Discussed if not returned in 1-2 months to reach out to me to discuss provera challenge. She verbalizes and agrees.  Routine preventative health maintenance measures emphasized. Please refer to After Visit Summary for other counseling recommendations.      Doreene Burke, CNM [facility],  [facility]

## 2021-01-21 NOTE — Addendum Note (Signed)
Addended by: Mechele Claude on: 01/21/2021 11:22 AM   Modules accepted: Orders

## 2021-01-21 NOTE — Patient Instructions (Signed)
Preventive Care 21-30 Years Old, Female Preventive care refers to lifestyle choices and visits with your health care provider that can promote health and wellness. This includes:  A yearly physical exam. This is also called an annual wellness visit.  Regular dental and eye exams.  Immunizations.  Screening for certain conditions.  Healthy lifestyle choices, such as: ? Eating a healthy diet. ? Getting regular exercise. ? Not using drugs or products that contain nicotine and tobacco. ? Limiting alcohol use. What can I expect for my preventive care visit? Physical exam Your health care provider may check your:  Height and weight. These may be used to calculate your BMI (body mass index). BMI is a measurement that tells if you are at a healthy weight.  Heart rate and blood pressure.  Body temperature.  Skin for abnormal spots. Counseling Your health care provider may ask you questions about your:  Past medical problems.  Family's medical history.  Alcohol, tobacco, and drug use.  Emotional well-being.  Home life and relationship well-being.  Sexual activity.  Diet, exercise, and sleep habits.  Work and work environment.  Access to firearms.  Method of birth control.  Menstrual cycle.  Pregnancy history. What immunizations do I need? Vaccines are usually given at various ages, according to a schedule. Your health care provider will recommend vaccines for you based on your age, medical history, and lifestyle or other factors, such as travel or where you work.   What tests do I need? Blood tests  Lipid and cholesterol levels. These may be checked every 5 years starting at age 20.  Hepatitis C test.  Hepatitis B test. Screening  Diabetes screening. This is done by checking your blood sugar (glucose) after you have not eaten for a while (fasting).  STD (sexually transmitted disease) testing, if you are at risk.  BRCA-related cancer screening. This may be  done if you have a family history of breast, ovarian, tubal, or peritoneal cancers.  Pelvic exam and Pap test. This may be done every 3 years starting at age 21. Starting at age 30, this may be done every 5 years if you have a Pap test in combination with an HPV test. Talk with your health care provider about your test results, treatment options, and if necessary, the need for more tests.   Follow these instructions at home: Eating and drinking  Eat a healthy diet that includes fresh fruits and vegetables, whole grains, lean protein, and low-fat dairy products.  Take vitamin and mineral supplements as recommended by your health care provider.  Do not drink alcohol if: ? Your health care provider tells you not to drink. ? You are pregnant, may be pregnant, or are planning to become pregnant.  If you drink alcohol: ? Limit how much you have to 0-1 drink a day. ? Be aware of how much alcohol is in your drink. In the [place] one drink equals one 12 oz bottle of beer (355 mL), one 5 oz glass of wine (148 mL), or one 1 oz glass of hard liquor (44 mL).   Lifestyle  Take daily care of your teeth and gums. Brush your teeth every morning and night with fluoride toothpaste. Floss one time each day.  Stay active. Exercise for at least 30 minutes 5 or more days each week.  Do not use any products that contain nicotine or tobacco, such as cigarettes, e-cigarettes, and chewing tobacco. If you need help quitting, ask your health care provider.  Do not   use drugs.  If you are sexually active, practice safe sex. Use a condom or other form of protection to prevent STIs (sexually transmitted infections).  If you do not wish to become pregnant, use a form of birth control. If you plan to become pregnant, see your health care provider for a prepregnancy visit.  Find healthy ways to cope with stress, such as: ? Meditation, yoga, or listening to music. ? Journaling. ? Talking to a trusted  person. ? Spending time with friends and family. Safety  Always wear your seat belt while driving or riding in a vehicle.  Do not drive: ? If you have been drinking alcohol. Do not ride with someone who has been drinking. ? When you are tired or distracted. ? While texting.  Wear a helmet and other protective equipment during sports activities.  If you have firearms in your house, make sure you follow all gun safety procedures.  Seek help if you have been physically or sexually abused. What's next?  Go to your health care provider once a year for an annual wellness visit.  Ask your health care provider how often you should have your eyes and teeth checked.  Stay up to date on all vaccines. This information is not intended to replace advice given to you by your health care provider. Make sure you discuss any questions you have with your health care provider. Document Revised: 07/07/2020 Document Reviewed: 07/21/2018 Elsevier Patient Education  2021 Elsevier Inc.  

## 2021-01-23 LAB — CYTOLOGY - PAP: Diagnosis: NEGATIVE

## 2021-01-29 ENCOUNTER — Encounter: Payer: 59 | Admitting: Certified Nurse Midwife

## 2021-06-02 ENCOUNTER — Encounter: Payer: Self-pay | Admitting: Certified Nurse Midwife

## 2021-06-02 ENCOUNTER — Ambulatory Visit (INDEPENDENT_AMBULATORY_CARE_PROVIDER_SITE_OTHER): Payer: 59 | Admitting: Certified Nurse Midwife

## 2021-06-02 ENCOUNTER — Other Ambulatory Visit: Payer: Self-pay

## 2021-06-02 VITALS — BP 128/77 | HR 80 | Ht 66.0 in | Wt 227.3 lb

## 2021-06-02 DIAGNOSIS — Z8759 Personal history of other complications of pregnancy, childbirth and the puerperium: Secondary | ICD-10-CM | POA: Diagnosis not present

## 2021-06-02 DIAGNOSIS — Z3200 Encounter for pregnancy test, result unknown: Secondary | ICD-10-CM

## 2021-06-02 LAB — POCT URINE PREGNANCY: Preg Test, Ur: POSITIVE — AB

## 2021-06-02 MED ORDER — DOXYLAMINE-PYRIDOXINE 10-10 MG PO TBEC: 1.0000 | DELAYED_RELEASE_TABLET | Freq: Four times a day (QID) | ORAL | 5 refills | Status: DC

## 2021-06-02 NOTE — Progress Notes (Signed)
Subjective:    Julie Rowe is a 30 y.o. female who presents for evaluation of amenorrhea. She believes she could be pregnant. Pregnancy is desired. Sexual Activity: single partner, contraception: none. Current symptoms also include: fatigue. Last period was normal.   Patient's last menstrual period was 04/10/2021 (exact date). The following portions of the patient's history were reviewed and updated as appropriate: allergies, current medications, past family history, past medical history, past social history, past surgical history, and problem list.  Review of Systems Pertinent items are noted in HPI.     Objective:    BP 128/77   Pulse 80   Ht 5\' 6"  (1.676 m)   Wt 227 lb 4.8 oz (103.1 kg)   LMP 04/10/2021 (Exact Date)   BMI 36.69 kg/m  General: alert, cooperative, appears stated age, mild distress, and no acute distress    Lab Review Urine HCG: positive    Assessment:    Absence of menstruation.     Plan:    Pregnancy Test:  Positive: EDC: 01/15/2022. Briefly discussed pre-natal care options. Md and midwifery care reviewed.  Encouraged well-balanced diet, plenty of rest when needed, pre-natal vitamins daily and walking for exercise. Discussed self-help for nausea, avoiding OTC medications until consulting provider or pharmacist, other than Tylenol as needed, minimal caffeine (1-2 cups daily) and avoiding alcohol. She will schedule u/s for dating /viability, nurse visit in 3 wks and her initial NOB visit in 5 wks.  Pt c/o nausea, orders placed for diclegis. Feel free to call with any questions.   03-25-2006, CNM

## 2021-06-02 NOTE — Patient Instructions (Signed)
https://www.acog.org/womens-health/faqs/prenatal-genetic-screening-tests">  Prenatal Care Prenatal care is health care during pregnancy. It helps you and your unborn baby (fetus) stay as healthy as possible. Prenatal care may be provided by a midwife, a family practice doctor, a mid-level practitioner (nurse practitioner or physician assistant), or a childbirth and pregnancy doctor (obstetrician). How does this affect me? During pregnancy, you will be closely monitored for any new conditions that might develop. To lower your risk of pregnancy complications, you and yourhealth care provider will talk about any underlying conditions you have. How does this affect my baby? Early and consistent prenatal care increases the chance that your baby will be healthy during pregnancy. Prenatal care lowers the risk that your baby will be: Born early (prematurely). Smaller than expected at birth (small for gestational age). What can I expect at the first prenatal care visit? Your first prenatal care visit will likely be the longest. You should schedule your first prenatal care visit as soon as you know that you are pregnant. Your first visit is a good time to talk about any questions or concerns you haveabout pregnancy. Medical history At your visit, you and your health care provider will talk about your medical history, including: Any past pregnancies. Your family's medical history. Medical history of the baby's father. Any long-term (chronic) health conditions you have and how you manage them. Any surgeries or procedures you have had. Any current over-the-counter or prescription medicines, herbs, or supplements that you are taking. Other factors that could pose a risk to your baby, including: Exposure to harmful chemicals or radiation at work or at home. Any substance use, including tobacco, alcohol, and drug use. Your home setting and your stress levels, including: Exposure to abuse or  violence. Household financial strain. Your daily health habits, including diet and exercise. Tests and screenings Your health care provider will: Measure your weight, height, and blood pressure. Do a physical exam, including a pelvic and breast exam. Perform blood tests and urine tests to check for: Urinary tract infection. Sexually transmitted infections (STIs). Low iron levels in your blood (anemia). Blood type and certain proteins on red blood cells (Rh antibodies). Infections and immunity to viruses, such as hepatitis B and rubella. HIV (human immunodeficiency virus). Discuss your options for genetic screening. Tips about staying healthy Your health care provider will also give you information about how to keep yourself and your baby healthy, including: Nutrition and taking vitamins. Physical activity. How to manage pregnancy symptoms such as nausea and vomiting (morning sickness). Infections and substances that may be harmful to your baby and how to avoid them. Food safety. Dental care. Working. Travel. Warning signs to watch for and when to call your health care provider. How often will I have prenatal care visits? After your first prenatal care visit, you will have regular visits throughout your pregnancy. The visit schedule is often as follows: Up to week 28 of pregnancy: once every 4 weeks. 28-36 weeks: once every 2 weeks. After 36 weeks: every week until delivery. Some women may have visits more or less often depending on any underlyinghealth conditions and the health of the baby. Keep all follow-up and prenatal care visits. This is important. What happens during routine prenatal care visits? Your health care provider will: Measure your weight and blood pressure. Check for fetal heart sounds. Measure the height of your uterus in your abdomen (fundal height). This may be measured starting around week 20 of pregnancy. Check the position of your baby inside your  uterus. Ask questions   about your diet, sleeping patterns, and whether you can feel the baby move. Review warning signs to watch for and signs of labor. Ask about any pregnancy symptoms you are having and how you are dealing with them. Symptoms may include: Headaches. Nausea and vomiting. Vaginal discharge. Swelling. Fatigue. Constipation. Changes in your vision. Feeling persistently sad or anxious. Any discomfort, including back or pelvic pain. Bleeding or spotting. Make a list of questions to ask your health care provider at your routinevisits. What tests might I have during prenatal care visits? You may have blood, urine, and imaging tests throughout your pregnancy, such as: Urine tests to check for glucose, protein, or signs of infection. Glucose tests to check for a form of diabetes that can develop during pregnancy (gestational diabetes mellitus). This is usually done around week 24 of pregnancy. Ultrasounds to check your baby's growth and development, to check for birth defects, and to check your baby's well-being. These can also help to decide when you should deliver your baby. A test to check for group B strep (GBS) infection. This is usually done around week 36 of pregnancy. Genetic testing. This may include blood, fluid, or tissue sampling, or imaging tests, such as an ultrasound. Some genetic tests are done during the first trimester and some are done during the second trimester. What else can I expect during prenatal care visits? Your health care provider may recommend getting certain vaccines during pregnancy. These may include: A yearly flu shot (annual influenza vaccine). This is especially important if you will be pregnant during flu season. Tdap (tetanus, diphtheria, pertussis) vaccine. Getting this vaccine during pregnancy can protect your baby from whooping cough (pertussis) after birth. This vaccine may be recommended between weeks 27 and 36 of pregnancy. A COVID-19  vaccine. Later in your pregnancy, your health care provider may give you information about: Childbirth and breastfeeding classes. Choosing a health care provider for your baby. Umbilical cord banking. Breastfeeding. Birth control after your baby is born. The hospital labor and delivery unit and how to set up a tour. Registering at the hospital before you go into labor. Where to find more information Office on Women's Health: womenshealth.gov American Pregnancy Association: americanpregnancy.org March of Dimes: marchofdimes.org Summary Prenatal care helps you and your baby stay as healthy as possible during pregnancy. Your first prenatal care visit will most likely be the longest. You will have visits and tests throughout your pregnancy to monitor your health and your baby's health. Bring a list of questions to your visits to ask your health care provider. Make sure to keep all follow-up and prenatal care visits. This information is not intended to replace advice given to you by your health care provider. Make sure you discuss any questions you have with your healthcare provider. Document Revised: 08/22/2020 Document Reviewed: 08/22/2020 Elsevier Patient Education  2022 Elsevier Inc.  

## 2021-06-25 ENCOUNTER — Other Ambulatory Visit: Payer: Self-pay | Admitting: Certified Nurse Midwife

## 2021-06-30 ENCOUNTER — Other Ambulatory Visit: Payer: Self-pay | Admitting: Certified Nurse Midwife

## 2021-06-30 ENCOUNTER — Other Ambulatory Visit: Payer: Self-pay

## 2021-06-30 ENCOUNTER — Ambulatory Visit
Admission: RE | Admit: 2021-06-30 | Discharge: 2021-06-30 | Disposition: A | Discharge status: Home / Self Care | Payer: 59 | Source: Ambulatory Visit | Attending: Certified Nurse Midwife | Admitting: Certified Nurse Midwife

## 2021-06-30 DIAGNOSIS — Z3200 Encounter for pregnancy test, result unknown: Secondary | ICD-10-CM

## 2021-06-30 DIAGNOSIS — Z3A11 11 weeks gestation of pregnancy: Secondary | ICD-10-CM | POA: Diagnosis not present

## 2021-06-30 DIAGNOSIS — O3680X0 Pregnancy with inconclusive fetal viability, not applicable or unspecified: Secondary | ICD-10-CM | POA: Diagnosis not present

## 2021-07-03 ENCOUNTER — Other Ambulatory Visit: Payer: Self-pay

## 2021-07-03 ENCOUNTER — Ambulatory Visit: Payer: 59 | Admitting: Certified Nurse Midwife

## 2021-07-03 VITALS — BP 99/66 | HR 73 | Resp 16 | Ht 66.0 in | Wt 228.6 lb

## 2021-07-03 DIAGNOSIS — Z3401 Encounter for supervision of normal first pregnancy, first trimester: Secondary | ICD-10-CM | POA: Diagnosis not present

## 2021-07-03 DIAGNOSIS — O99210 Obesity complicating pregnancy, unspecified trimester: Secondary | ICD-10-CM

## 2021-07-03 DIAGNOSIS — Z113 Encounter for screening for infections with a predominantly sexual mode of transmission: Secondary | ICD-10-CM

## 2021-07-03 DIAGNOSIS — R69 Illness, unspecified: Secondary | ICD-10-CM | POA: Diagnosis not present

## 2021-07-03 DIAGNOSIS — Z3A12 12 weeks gestation of pregnancy: Secondary | ICD-10-CM | POA: Diagnosis not present

## 2021-07-03 NOTE — Patient Instructions (Signed)
Morning Sickness  Morning sickness is when you feel like you may vomit (feel nauseous) during pregnancy. Sometimes, you may vomit. Morning sickness most often happens in the morning, but it can also happen at any time of the day. Some women may have morning sickness that makes them vomit all the time. This is amore serious problem that needs treatment. What are the causes? The cause of this condition is not known. What increases the risk? You had vomiting or a feeling like you may vomit before your pregnancy. You had morning sickness in another pregnancy. You are pregnant with more than one baby, such as twins. What are the signs or symptoms? Feeling like you may vomit. Vomiting. How is this treated? Treatment is usually not needed for this condition. You may only need to change what you eat. In some cases, your doctor may give you some things to take for your condition. These include: Vitamin B6 supplements. Medicines to treat the feeling that you may vomit. Ginger. Follow these instructions at home: Medicines Take over-the-counter and prescription medicines only as told by your doctor. Do not take any medicines until you talk with your doctor about them first. Take multivitamins before you get pregnant. These can stop or lessen the symptoms of morning sickness. Eating and drinking Eat dry toast or crackers before getting out of bed. Eat 5 or 6 small meals a day. Eat dry and bland foods like rice and baked potatoes. Do not eat greasy, fatty, or spicy foods. Have someone cook for you if the smell of food causes you to vomit or to feel like you may vomit. If you feel like you may vomit after taking prenatal vitamins, take them at night or with a snack. Eat protein foods when you need a snack. Nuts, yogurt, and cheese are good choices. Drink fluids throughout the day. Try ginger ale made with real ginger, ginger tea made from fresh grated ginger, or ginger candies. General  instructions Do not smoke or use any products that contain nicotine or tobacco. If you need help quitting, ask your doctor. Use an air purifier to keep the air in your house free of smells. Get lots of fresh air. Try to avoid smells that make you feel sick. Try wearing an acupressure wristband. This is a wristband that is used to treat seasickness. Try a treatment called acupuncture. In this treatment, a doctor puts needles into certain areas of your body to make you feel better. Contact a doctor if: You need medicine to feel better. You feel dizzy or light-headed. You are losing weight. Get help right away if: The feeling that you may vomit will not go away, or you cannot stop vomiting. You faint. You have very bad pain in your belly. Summary Morning sickness is when you feel like you may vomit (feel nauseous) during pregnancy. You may feel sick in the morning, but you can feel this way at any time of the day. Making some changes to what you eat may help your symptoms go away. This information is not intended to replace advice given to you by your health care provider. Make sure you discuss any questions you have with your healthcare provider. Document Revised: 06/24/2020 Document Reviewed: 06/03/2020 Elsevier Patient Education  2022 [employer]. http://www.bray.com/.html">  First Trimester of Pregnancy  The first trimester of pregnancy starts on the first day of your last menstrual period until the end of week 12. This is also called months 1 through 3 ofpregnancy. Body changes during  your first trimester Your body goes through many changes during pregnancy. The changes usuallyreturn to normal after your baby is born. Physical changes You may gain or lose weight. Your breasts may grow larger and hurt. The area around your nipples may get darker. Dark spots or blotches may develop on your face. You may have changes in your hair. Health changes You may feel  like you might vomit (nauseous), and you may vomit. You may have heartburn. You may have headaches. You may have trouble pooping (constipation). Your gums may bleed. Other changes You may get tired easily. You may pee (urinate) more often. Your menstrual periods will stop. You may not feel hungry. You may want to eat certain kinds of food. You may have changes in your emotions from day to day. You may have more dreams. Follow these instructions at home: Medicines Take over-the-counter and prescription medicines only as told by your doctor. Some medicines are not safe during pregnancy. Take a prenatal vitamin that contains at least 600 micrograms (mcg) of folic acid. Eating and drinking Eat healthy meals that include: Fresh fruits and vegetables. Whole grains. Good sources of protein, such as meat, eggs, or tofu. Low-fat dairy products. Avoid raw meat and unpasteurized juice, milk, and cheese. If you feel like you may vomit, or you vomit: Eat 4 or 5 small meals a day instead of 3 large meals. Try eating a few soda crackers. Drink liquids between meals instead of during meals. You may need to take these actions to prevent or treat trouble pooping: Drink enough fluids to keep your pee (urine) pale yellow. Eat foods that are high in fiber. These include beans, whole grains, and fresh fruits and vegetables. Limit foods that are high in fat and sugar. These include fried or sweet foods. Activity Exercise only as told by your doctor. Most people can do their usual exercise routine during pregnancy. Stop exercising if you have cramps or pain in your lower belly (abdomen) or low back. Do not exercise if it is too hot or too humid, or if you are in a place of great height (high altitude). Avoid heavy lifting. If you choose to, you may have sex unless your doctor tells you not to. Relieving pain and discomfort Wear a good support bra if your breasts are sore. Rest with your legs raised  (elevated) if you have leg cramps or low back pain. If you have bulging veins (varicose veins) in your legs: Wear support hose as told by your doctor. Raise your feet for 15 minutes, 3-4 times a day. Limit salt in your food. Safety Wear your seat belt at all times when you are in a car. Talk with your doctor if someone is hurting you or yelling at you. Talk with your doctor if you are feeling sad or have thoughts of hurting yourself. Lifestyle Do not use hot tubs, steam rooms, or saunas. Do not douche. Do not use tampons or scented sanitary pads. Do not use herbal medicines, illegal drugs, or medicines that are not approved by your doctor. Do not drink alcohol. Do not smoke or use any products that contain nicotine or tobacco. If you need help quitting, ask your doctor. Avoid cat litter boxes and soil that is used by cats. These carry germs that can cause harm to the baby and can cause a loss of your baby by miscarriage or stillbirth. General instructions Keep all follow-up visits. This is important. Ask for help if you need counseling or if you  need help with nutrition. Your doctor can give you advice or tell you where to go for help. Visit your dentist. At home, brush your teeth with a soft toothbrush. Floss gently. Write down your questions. Take them to your prenatal visits. Where to find more information American Pregnancy Association: americanpregnancy.org [employer]ians [employer]necologists: www.acog.org Office on Women's Health: MightyReward.co.nz Contact a doctor if: You are dizzy. You have a fever. You have mild cramps or pressure in your lower belly. You have a nagging pain in your belly area. You continue to feel like you may vomit, you vomit, or you have watery poop (diarrhea) for 24 hours or longer. You have a bad-smelling fluid coming from your vagina. You have pain when you pee. You are exposed to a disease that spreads from person to person,  such as chickenpox, measles, Zika virus, HIV, or hepatitis. Get help right away if: You have spotting or bleeding from your vagina. You have very bad belly cramping or pain. You have shortness of breath or chest pain. You have any kind of injury, such as from a fall or a car crash. You have new or increased pain, swelling, or redness in an arm or leg. Summary The first trimester of pregnancy starts on the first day of your last menstrual period until the end of week 12 (months 1 through 3). Eat 4 or 5 small meals a day instead of 3 large meals. Do not smoke or use any products that contain nicotine or tobacco. If you need help quitting, ask your doctor. Keep all follow-up visits. This information is not intended to replace advice given to you by your health care provider. Make sure you discuss any questions you have with your healthcare provider. Document Revised: 04/17/2020 Document Reviewed: 02/22/2020 Elsevier Patient Education  2022 Elsevier Inc. Commonly Asked Questions During Pregnancy  Cats: A parasite can be excreted in cat feces.  To avoid exposure you need to have another person empty the little box.  If you must empty the litter box you will need to wear gloves.  Wash your hands after handling your cat.  This parasite can also be found in raw or undercooked meat so this should also be avoided.  Colds, Sore Throats, Flu: Please check your medication sheet to see what you can take for symptoms.  If your symptoms are unrelieved by these medications please call the office.  Dental Work: Most any dental work Agricultural consultant recommends is permitted.  X-rays should only be taken during the first trimester if absolutely necessary.  Your abdomen should be shielded with a lead apron during all x-rays.  Please notify your provider prior to receiving any x-rays.  Novocaine is fine; gas is not recommended.  If your dentist requires a note from [place] prior to dental work please call the office and we  will provide one for you.  Exercise: Exercise is an important part of staying healthy during your pregnancy.  You may continue most exercises you were accustomed to prior to pregnancy.  Later in your pregnancy you will most likely notice you have difficulty with activities requiring balance like riding a bicycle.  It is important that you listen to your body and avoid activities that put you at a higher risk of falling.  Adequate rest and staying well hydrated are a must!  If you have questions about the safety of specific activities ask your provider.    Exposure to Children with illness: Try to avoid obvious exposure;  report any symptoms to [place] when noted,  If you have chicken pos, red measles or mumps, you should be immune to these diseases.   Please do not take any vaccines while pregnant unless you have checked with your OB provider.  Fetal Movement: After 28 weeks we recommend you do "kick counts" twice daily.  Lie or sit down in a calm quiet environment and count your baby movements "kicks".  You should feel your baby at least 10 times per hour.  If you have not felt 10 kicks within the first hour get up, walk around and have something sweet to eat or drink then repeat for an additional hour.  If count remains less than 10 per hour notify your provider.  Fumigating: Follow your pest control agent's advice as to how long to stay out of your home.  Ventilate the area well before re-entering.  Hemorrhoids:   Most over-the-counter preparations can be used during pregnancy.  Check your medication to see what is safe to use.  It is important to use a stool softener or fiber in your diet and to drink lots of liquids.  If hemorrhoids seem to be getting worse please call the office.   Hot Tubs:  Hot tubs Jacuzzis and saunas are not recommended while pregnant.  These increase your internal body temperature and should be avoided.  Intercourse:  Sexual intercourse is safe during pregnancy as long as you are  comfortable, unless otherwise advised by your provider.  Spotting may occur after intercourse; report any bright red bleeding that is heavier than spotting.  Labor:  If you know that you are in labor, please go to the hospital.  If you are unsure, please call the office and let us help you decide what to do.  Lifting, straining, etc:  If your job requires heavy lifting or straining please check with your provider for any limitations.  Generally, you should not lift items heavier than that you can lift simply with your hands and arms (no back muscles)  Painting:  Paint fumes do not harm your pregnancy, but may make you ill and should be avoided if possible.  Latex or water based paints have less odor than oils.  Use adequate ventilation while painting.  Permanents & Hair Color:  Chemicals in hair dyes are not recommended as they cause increase hair dryness which can increase hair loss during pregnancy.  " Highlighting" and permanents are allowed.  Dye may be absorbed differently and permanents may not hold as well during pregnancy.  Sunbathing:  Use a sunscreen, as skin burns easily during pregnancy.  Drink plenty of fluids; avoid over heating.  Tanning Beds:  Because their possible side effects are still unknown, tanning beds are not recommended.  Ultrasound Scans:  Routine ultrasounds are performed at approximately 20 weeks.  You will be able to see your baby's general anatomy an if you would like to know the gender this can usually be determined as well.  If it is questionable when you conceived you may also receive an ultrasound early in your pregnancy for dating purposes.  Otherwise ultrasound exams are not routinely performed unless there is a medical necessity.  Although you can request a scan we ask that you pay for it when conducted because insurance does not cover " patient request" scans.  Work: If your pregnancy proceeds without complications you may work until your due date, unless your  physician or employer advises otherwise.  Round Ligament Pain/Pelvic Discomfort:  Sharp, shooting  pains not associated with bleeding are fairly common, usually occurring in the second trimester of pregnancy.  They tend to be worse when standing up or when you remain standing for long periods of time.  These are the result of pressure of certain pelvic ligaments called "round ligaments".  Rest, Tylenol and heat seem to be the most effective relief.  As the womb and fetus grow, they rise out of the pelvis and the discomfort improves.  Please notify the office if your pain seems different than that described.  It may represent a more serious condition.  Linden DolinWaterbirth Class  May 05, 2017  Wednesday 7:00p - 9:00p  [facility] [place]  June 09, 2017  Wednesday 7:00p - 9:00p [facility] [place]    July 14, 2017   Wednesday 7:00p - 9:000p [facility] [place]  August 11, 2017  Wednesday  7:00p - 9:00p [facility] [place]  September 08, 2017 Wednesday 7:00p - 9:00p [facility] [place]  Interested in a waterbirth?  This informational class will help you discover whether waterbirth is the right fit for you.  Education about waterbirth itself, supplies you would need and how to assemble your support team is what you can expect from this class.  Some obstetrical practices require this class in order to pursue a waterbirth.  (Not all obstetrical practices offer waterbirth check with your healthcare provider)  Register only the expectant mom, but you are encouraged to bring your partner to class!  Fees & Payment No fee  Register Online [email or web address] Search [facility] [place] [facility] List  [facility]  [address], [place], [place] [address]  Phone: [phone]  [facility] (second location)  [address] [place], [place] [address]  Phone: [phone]  [facility] [address], [place], [place] [address] Phone: [phone]  [facility]  [address] [place], [place] [address]  Phone: [phone]

## 2021-07-03 NOTE — Progress Notes (Signed)
[name] presents for NOB nurse interview visit. Pregnancy confirmation done 04/10/2021 . [id number]. Pregnancy education material explained and given. No cats in the home. NOB labs ordered. TSH/HbgA1c due to Increased BMI. Body mass index is 36.9 kg/m.  HIV labs and Drug screen were explained optional and she did decline. Drug screen declined. PNV encouraged. Genetic screening options discussed. Genetic testing: Declined.  Pt may discuss with provider.  Financial policy reviewed. FMLA from reviewed and signed. Pt. To follow up with provider in 1 weeks for NOB physical.  All questions answered.

## 2021-07-04 LAB — URINALYSIS, ROUTINE W REFLEX MICROSCOPIC
Bilirubin, UA: NEGATIVE
Glucose, UA: NEGATIVE
Ketones, UA: NEGATIVE
Leukocytes,UA: NEGATIVE
Nitrite, UA: NEGATIVE
Protein,UA: NEGATIVE
RBC, UA: NEGATIVE
Specific Gravity, UA: 1.012 (ref 1.005–1.030)
Urobilinogen, Ur: 0.2 mg/dL (ref 0.2–1.0)
pH, UA: 6.5 (ref 5.0–7.5)

## 2021-07-05 LAB — ANTIBODY SCREEN: Antibody Screen: NEGATIVE

## 2021-07-05 LAB — ABO AND RH: Rh Factor: POSITIVE

## 2021-07-05 LAB — VIRAL HEPATITIS HBV, HCV
HCV Ab: <0.1 s/co ratio (ref 0.0–0.9)
Hep B Core Total Ab: NEGATIVE
Hep B Surface Ab, Qual: REACTIVE
Hepatitis B Surface Ag: NEGATIVE

## 2021-07-05 LAB — RPR: RPR Ser Ql: NONREACTIVE

## 2021-07-05 LAB — HCV INTERPRETATION

## 2021-07-05 LAB — PARVOVIRUS B19 ANTIBODY, IGG AND IGM
Parvovirus B19 IgG: 0.2 index (ref 0.0–0.8)
Parvovirus B19 IgM: 0.3 index (ref 0.0–0.8)

## 2021-07-05 LAB — CULTURE, OB URINE

## 2021-07-05 LAB — THYROID PANEL WITH TSH
Free Thyroxine Index: 1.8 (ref 1.2–4.9)
T3 Uptake Ratio: 17 % — ABNORMAL LOW (ref 24–39)
T4, Total: 10.3 ug/dL (ref 4.5–12.0)
TSH: 2.39 u[IU]/mL (ref 0.450–4.500)

## 2021-07-05 LAB — HEMOGLOBIN A1C
Est. average glucose Bld gHb Est-mCnc: 117 mg/dL
Hgb A1c MFr Bld: 5.7 % — ABNORMAL HIGH (ref 4.8–5.6)

## 2021-07-05 LAB — URINE CULTURE, OB REFLEX

## 2021-07-05 LAB — RUBELLA SCREEN: Rubella Antibodies, IGG: 2.63 index (ref >0.99)

## 2021-07-05 LAB — VARICELLA ZOSTER ANTIBODY, IGG: Varicella zoster IgG: 297 index (ref >165)

## 2021-07-07 ENCOUNTER — Other Ambulatory Visit: Payer: Self-pay

## 2021-07-07 ENCOUNTER — Ambulatory Visit (INDEPENDENT_AMBULATORY_CARE_PROVIDER_SITE_OTHER): Payer: 59 | Admitting: Certified Nurse Midwife

## 2021-07-07 ENCOUNTER — Encounter: Payer: Self-pay | Admitting: Certified Nurse Midwife

## 2021-07-07 VITALS — BP 125/82 | HR 83 | Wt 223.7 lb

## 2021-07-07 DIAGNOSIS — O99210 Obesity complicating pregnancy, unspecified trimester: Secondary | ICD-10-CM | POA: Insufficient documentation

## 2021-07-07 DIAGNOSIS — O99211 Obesity complicating pregnancy, first trimester: Secondary | ICD-10-CM

## 2021-07-07 DIAGNOSIS — Z3481 Encounter for supervision of other normal pregnancy, first trimester: Secondary | ICD-10-CM

## 2021-07-07 DIAGNOSIS — Z3A12 12 weeks gestation of pregnancy: Secondary | ICD-10-CM

## 2021-07-07 DIAGNOSIS — O99212 Obesity complicating pregnancy, second trimester: Secondary | ICD-10-CM | POA: Diagnosis not present

## 2021-07-07 LAB — POCT URINALYSIS DIPSTICK OB
Bilirubin, UA: NEGATIVE
Blood, UA: NEGATIVE
Glucose, UA: NEGATIVE
Ketones, UA: NEGATIVE
Leukocytes, UA: NEGATIVE
Nitrite, UA: NEGATIVE
POC,PROTEIN,UA: NEGATIVE
Spec Grav, UA: 1.015 (ref 1.010–1.025)
Urobilinogen, UA: 0.2 E.U./dL
pH, UA: 7 (ref 5.0–8.0)

## 2021-07-07 LAB — GC/CHLAMYDIA PROBE AMP
Chlamydia trachomatis, NAA: NEGATIVE
Neisseria Gonorrhoeae by PCR: NEGATIVE

## 2021-07-07 MED ORDER — ASPIRIN EC 81 MG PO TBEC: 81.0000 mg | DELAYED_RELEASE_TABLET | Freq: Every day | ORAL | 11 refills | Status: DC

## 2021-07-07 NOTE — Progress Notes (Signed)
NEW OB HISTORY AND PHYSICAL  SUBJECTIVE:       Julie Rowe is a 30 y.o. G36P1011 female, Patient's last menstrual period was 04/10/2021 (exact date)., Estimated Date of Delivery: 01/15/22, [redacted]w[redacted]d, presents today for establishment of Prenatal Care. She has no unusual complaints.    Body mass index is 36.11 kg/m.   Gynecologic History Patient's last menstrual period was 04/10/2021 (exact date). Normal Contraception: none Last Pap: 01/21/2021. Results were: normal  Obstetric History OB History  Gravida Para Term Preterm AB Living  3 1 1  0 1 1  SAB IAB Ectopic Multiple Live Births  1 0 0 0 1    # Outcome Date GA Lbr Len/2nd Weight Sex Delivery Anes PTL Lv  3 Current           2 SAB 01/07/21 [redacted]w[redacted]d         1 Term 06/20/19 [redacted]w[redacted]d 11:17 / 03:13 8 lb 8.9 oz (3.88 kg) M Vag-Spont EPI  LIV    Past Medical History:  Diagnosis Date   Hypothyroid    Thyroid disease     Past Surgical History:  Procedure Laterality Date   TONSILLECTOMY     TONSILLECTOMY AND ADENOIDECTOMY      Current Outpatient Medications on File Prior to Visit  Medication Sig Dispense Refill   Doxylamine-Pyridoxine 10-10 MG TBEC Take 1 tablet by mouth 4 (four) times daily. Day 1 &2: 2 tablet at bedtimeDay 3 : if symptoms persists 1 tablet am; 2 tablet at bedtimeDay 4: 1 tablet am, 1 tab afternoon, 2 tab at bedtime 120 tablet 5   levothyroxine (SYNTHROID) 88 MCG tablet TAKE 1 TABLET BY MOUTH DAILY BEFORE BREAKFAST. 90 tablet 1   Prenatal Vit-Fe Fumarate-FA (PRENATAL MULTIVITAMIN) TABS tablet Take 1 tablet by mouth daily at 12 noon.     No current facility-administered medications on file prior to visit.    No Known Allergies  Social History   Socioeconomic History   Marital status: Married    Spouse name: [redacted]w[redacted]d    Number of children: Not on file   Years of education: Not on file   Highest education level: Not on file  Occupational History   Not on file  Tobacco Use   Smoking status: Never   Smokeless  tobacco: Never  Vaping Use   Vaping Use: Never used  Substance and Sexual Activity   Alcohol use: No   Drug use: No   Sexual activity: Yes    Birth control/protection: None    Comment: Natural family planning  Other Topics Concern   Not on file  Social History Narrative   Not on file   Social Determinants of Health   Financial Resource Strain: Not on file  Food Insecurity: Not on file  Transportation Needs: Not on file  Physical Activity: Not on file  Stress: Not on file  Social Connections: Not on file  Intimate Partner Violence: Not on file    Family History  Problem Relation Age of Onset   Diabetes Mother    Thyroid disease Mother    Diabetes Brother    Thyroid disease Brother    Diabetes Maternal Grandmother    Thyroid disease Paternal Grandmother    Breast cancer Neg Hx    Ovarian cancer Neg Hx    Colon cancer Neg Hx     The following portions of the patient's history were reviewed and updated as appropriate: allergies, current medications, past OB history, past medical history, past surgical history, past family history, past  social history, and problem list.    OBJECTIVE: Initial Physical Exam (New OB)  GENERAL APPEARANCE: alert, well appearing, in no apparent distress, oriented to person, place and time HEAD: normocephalic, atraumatic MOUTH: mucous membranes moist, pharynx normal without lesions THYROID: no thyromegaly or masses present BREASTS: no masses noted, no significant tenderness, no palpable axillary nodes, no skin changes LUNGS: clear to auscultation, no wheezes, rales or rhonchi, symmetric air entry HEART: regular rate and rhythm, no murmurs ABDOMEN: soft, nontender, nondistended, no abnormal masses, no epigastric pain, obese, and FHT present EXTREMITIES: no redness or tenderness in the calves or thighs, no edema, no limitation in range of motion, intact peripheral pulses SKIN: normal coloration and turgor, no rashes LYMPH NODES: no adenopathy  palpable NEUROLOGIC: alert, oriented, normal speech, no focal findings or movement disorder noted  PELVIC EXAM deferred , tested pelvis, pap not due  ASSESSMENT: Normal pregnancy  PLAN: Prenatal care See orders New OB counseling:  The patient has been given an overview regarding routine prenatal care. Recommendations regarding diet, weight gain, and exercise in pregnancy were given. Prenatal testing, optional genetic testing, carrier screening, and ultrasound use in pregnancy were reviewed. Benefits of Breast Feeding were discussed. The patient is encouraged to consider nursing her baby post partum. Pt to start 81 mg Asa at 16 wks due to elevated BMI. Will have early glucose screen next visit.   Doreene Burke, CNM

## 2021-08-05 ENCOUNTER — Other Ambulatory Visit: Payer: Self-pay

## 2021-08-05 ENCOUNTER — Ambulatory Visit (INDEPENDENT_AMBULATORY_CARE_PROVIDER_SITE_OTHER): Payer: 59 | Admitting: Certified Nurse Midwife

## 2021-08-05 ENCOUNTER — Encounter: Payer: Self-pay | Admitting: Certified Nurse Midwife

## 2021-08-05 ENCOUNTER — Other Ambulatory Visit: Payer: 59

## 2021-08-05 VITALS — BP 119/72 | HR 84 | Wt 226.7 lb

## 2021-08-05 DIAGNOSIS — Z3A16 16 weeks gestation of pregnancy: Secondary | ICD-10-CM

## 2021-08-05 DIAGNOSIS — Z3482 Encounter for supervision of other normal pregnancy, second trimester: Secondary | ICD-10-CM

## 2021-08-05 DIAGNOSIS — Z3A12 12 weeks gestation of pregnancy: Secondary | ICD-10-CM

## 2021-08-05 DIAGNOSIS — O99212 Obesity complicating pregnancy, second trimester: Secondary | ICD-10-CM | POA: Diagnosis not present

## 2021-08-05 LAB — POCT URINALYSIS DIPSTICK OB
Bilirubin, UA: NEGATIVE
Blood, UA: NEGATIVE
Glucose, UA: NEGATIVE
Ketones, UA: NEGATIVE
Leukocytes, UA: NEGATIVE
Nitrite, UA: NEGATIVE
Odor: NEGATIVE
POC,PROTEIN,UA: NEGATIVE
Spec Grav, UA: 1.015 (ref 1.010–1.025)
Urobilinogen, UA: 0.2 E.U./dL
pH, UA: 7.5 (ref 5.0–8.0)

## 2021-08-05 NOTE — Patient Instructions (Signed)
Round Ligament Pain The round ligaments are a pair of cord-like tissues that help support the uterus. They can become a source of pain during pregnancy as the ligaments soften and stretch as the baby grows. The pain usually begins in the second trimester (13-28 weeks) of pregnancy, and should only last for a few seconds when it occurs. However, the pain can come and go until the baby is delivered. The pain does not cause harm to the baby. Round ligament pain is usually a short, sharp, and pinching pain, but it can also be a dull, lingering, and aching pain. The pain is felt in the lower side of the abdomen or in the groin. It usually starts deep in the groin and moves up to the outside of the hip area. The pain may happen when you: Suddenly change position, such as quickly going from a sitting to standing position. Do physical activity. Cough or sneeze. Follow these instructions at home: Managing pain  When the pain starts, relax. Then, try any of these methods to help with the pain: Sit down. Flex your knees up to your abdomen. Lie on your side with one pillow under your abdomen and another pillow between your legs. Sit in a warm bath for 15-20 minutes or until the pain goes away. General instructions Watch your condition for any changes. Move slowly when you sit down or stand up. Stop or reduce your physical activities if they cause pain. Avoid long walks if they cause pain. Take over-the-counter and prescription medicines only as told by your health care provider. Keep all follow-up visits. This is important. Contact a health care provider if: Your pain does not go away with treatment. You feel pain in your back that you did not have before. Your medicine is not helping. You have a fever or chills. You have nausea or vomiting. You have diarrhea. You have pain when you urinate. Get help right away if: You have pain that is a rhythmic, cramping pain similar to labor pains. Labor pains  are usually 2 minutes apart, last for about 1 minute, and involve a bearing down feeling or pressure in your pelvis. You have vaginal bleeding. These symptoms may represent a serious problem that is an emergency. Do not wait to see if the symptoms will go away. Get medical help right away. Call your local emergency services (911 in the [place] Do not drive yourself to the hospital. Summary Round ligament pain is felt in the lower abdomen or groin. This pain usually begins in the second trimester (13-28 weeks) and should only last for a few seconds when it occurs. You may notice the pain when you suddenly change position, when you cough or sneeze, or during physical activity. Relaxing, flexing your knees to your abdomen, lying on one side, or taking a warm bath may help to get rid of the pain. Contact your health care provider if the pain does not go away. This information is not intended to replace advice given to you by your health care provider. Make sure you discuss any questions you have with your health care provider. Document Revised: 01/22/2021 Document Reviewed: 01/22/2021 Elsevier Patient Education  2022 Elsevier Inc.  

## 2021-08-05 NOTE — Progress Notes (Signed)
ROB- early gtt. Declines Flu. No complaints.

## 2021-08-05 NOTE — Progress Notes (Signed)
ROB doing well, she is feeling some fluttering. She is having early glucose screen today due to elevated Hemoglobin A1c at [facility] labs. Discussed common discomforts in pregnancy and reviewed round ligament pain shelf help measures. Discussed anatomy u/s in 4 wks.   Doreene Burke, CNM

## 2021-08-06 LAB — CBC
Hematocrit: 36.6 % (ref 34.0–46.6)
Hemoglobin: 12.4 g/dL (ref 11.1–15.9)
MCH: 30.7 pg (ref 26.6–33.0)
MCHC: 33.9 g/dL (ref 31.5–35.7)
MCV: 91 fL (ref 79–97)
Platelets: 193 10*3/uL (ref 150–450)
RBC: 4.04 x10E6/uL (ref 3.77–5.28)
RDW: 13.2 % (ref 11.7–15.4)
WBC: 9.9 10*3/uL (ref 3.4–10.8)

## 2021-08-06 LAB — GLUCOSE TOLERANCE, 1 HOUR: Glucose, 1Hr PP: 112 mg/dL (ref 65–199)

## 2021-09-03 ENCOUNTER — Encounter: Payer: Self-pay | Admitting: Certified Nurse Midwife

## 2021-09-03 ENCOUNTER — Other Ambulatory Visit: Payer: Self-pay

## 2021-09-03 ENCOUNTER — Ambulatory Visit (INDEPENDENT_AMBULATORY_CARE_PROVIDER_SITE_OTHER): Payer: 59

## 2021-09-03 ENCOUNTER — Ambulatory Visit (INDEPENDENT_AMBULATORY_CARE_PROVIDER_SITE_OTHER): Payer: 59 | Admitting: Certified Nurse Midwife

## 2021-09-03 VITALS — BP 111/76 | HR 81 | Wt 228.8 lb

## 2021-09-03 DIAGNOSIS — Z3482 Encounter for supervision of other normal pregnancy, second trimester: Secondary | ICD-10-CM | POA: Diagnosis not present

## 2021-09-03 DIAGNOSIS — Z3A20 20 weeks gestation of pregnancy: Secondary | ICD-10-CM

## 2021-09-03 DIAGNOSIS — O321XX0 Maternal care for breech presentation, not applicable or unspecified: Secondary | ICD-10-CM

## 2021-09-03 LAB — POCT URINALYSIS DIPSTICK OB
Bilirubin, UA: NEGATIVE
Blood, UA: NEGATIVE
Glucose, UA: NEGATIVE
Ketones, UA: NEGATIVE
Leukocytes, UA: NEGATIVE
Nitrite, UA: NEGATIVE
POC,PROTEIN,UA: NEGATIVE
Spec Grav, UA: 1.01 (ref 1.010–1.025)
Urobilinogen, UA: 0.2 E.U./dL
pH, UA: 7.5 (ref 5.0–8.0)

## 2021-09-03 NOTE — Progress Notes (Signed)
ROB and anatomy scan today. U/s results reviewed (see below) . Incomplete, pt will return 2 wk for follow up.   Feeling some movement. Discussed musculoskeletal discomforts in pregnancy. Encouraged use of belly band prn. Self help measures reviewed. Follow up 4 wks or prn.   Doreene Burke, CNM   Patient Name: Julie Rowe DOB: [birth year] MRN: [id number]  ULTRASOUND REPORT  Location: [facility] Date of Service: 09/03/2021   Indications:Anatomy Ultrasound Findings:  Mason Jim intrauterine pregnancy is visualized with FHR at 153 BPM.   Biometrics give an (U/S) Gestational age of [redacted]w[redacted]d and an (U/S) EDD of 01/18/22; this correlates with the clinically established Estimated Date of Delivery: 01/15/22   Fetal presentation is Breech.  EFW: 342g / 12oz. Placenta: anterior. Grade: 0 AFI: subjectively normal.  Anatomic survey is incomplete for Nose/Lips and Profile/Nasal bone and normal; Gender - female.    Ovaries are not visualized. Survey of the adnexa demonstrates no adnexal masses.  There is no free peritoneal fluid in the cul de sac.  Impression: 1. [redacted]w[redacted]d Viable Singleton Intrauterine pregnancy by U/S. 2. (U/S) EDD is consistent with Clinically established Estimated Date of Delivery: 01/15/22 . 3. Incomplete Anatomy Scan for Profile, Nose Lips, Nasal bone.  Recommendations: 1.Clinical correlation with the patient's History and Physical Exam. 2. Patient booked for 2-4 week F/U US to complete Anatomy survey.  Sheralyn Boatman  [place]

## 2021-09-03 NOTE — Patient Instructions (Signed)
Round Ligament Pain The round ligaments are a pair of cord-like tissues that help support the uterus. They can become a source of pain during pregnancy as the ligaments soften and stretch as the baby grows. The pain usually begins in the second trimester (13-28 weeks) of pregnancy, and should only last for a few seconds when it occurs. However, the pain can come and go until the baby is delivered. The pain does not cause harm to the baby. Round ligament pain is usually a short, sharp, and pinching pain, but it can also be a dull, lingering, and aching pain. The pain is felt in the lower side of the abdomen or in the groin. It usually starts deep in the groin and moves up to the outside of the hip area. The pain may happen when you: Suddenly change position, such as quickly going from a sitting to standing position. Do physical activity. Cough or sneeze. Follow these instructions at home: Managing pain  When the pain starts, relax. Then, try any of these methods to help with the pain: Sit down. Flex your knees up to your abdomen. Lie on your side with one pillow under your abdomen and another pillow between your legs. Sit in a warm bath for 15-20 minutes or until the pain goes away. General instructions Watch your condition for any changes. Move slowly when you sit down or stand up. Stop or reduce your physical activities if they cause pain. Avoid long walks if they cause pain. Take over-the-counter and prescription medicines only as told by your health care provider. Keep all follow-up visits. This is important. Contact a health care provider if: Your pain does not go away with treatment. You feel pain in your back that you did not have before. Your medicine is not helping. You have a fever or chills. You have nausea or vomiting. You have diarrhea. You have pain when you urinate. Get help right away if: You have pain that is a rhythmic, cramping pain similar to labor pains. Labor pains  are usually 2 minutes apart, last for about 1 minute, and involve a bearing down feeling or pressure in your pelvis. You have vaginal bleeding. These symptoms may represent a serious problem that is an emergency. Do not wait to see if the symptoms will go away. Get medical help right away. Call your local emergency services (911 in the [place] Do not drive yourself to the hospital. Summary Round ligament pain is felt in the lower abdomen or groin. This pain usually begins in the second trimester (13-28 weeks) and should only last for a few seconds when it occurs. You may notice the pain when you suddenly change position, when you cough or sneeze, or during physical activity. Relaxing, flexing your knees to your abdomen, lying on one side, or taking a warm bath may help to get rid of the pain. Contact your health care provider if the pain does not go away. This information is not intended to replace advice given to you by your health care provider. Make sure you discuss any questions you have with your health care provider. Document Revised: 01/22/2021 Document Reviewed: 01/22/2021 Elsevier Patient Education  2022 Elsevier Inc.  

## 2021-09-23 ENCOUNTER — Ambulatory Visit (INDEPENDENT_AMBULATORY_CARE_PROVIDER_SITE_OTHER): Payer: 59

## 2021-09-23 ENCOUNTER — Other Ambulatory Visit: Payer: Self-pay

## 2021-09-23 DIAGNOSIS — Z3A20 20 weeks gestation of pregnancy: Secondary | ICD-10-CM

## 2021-09-23 DIAGNOSIS — Z3482 Encounter for supervision of other normal pregnancy, second trimester: Secondary | ICD-10-CM

## 2021-10-01 ENCOUNTER — Encounter: Payer: 59 | Admitting: Certified Nurse Midwife

## 2021-10-03 ENCOUNTER — Encounter: Payer: Self-pay | Admitting: Certified Nurse Midwife

## 2021-10-03 ENCOUNTER — Other Ambulatory Visit: Payer: Self-pay

## 2021-10-03 ENCOUNTER — Ambulatory Visit (INDEPENDENT_AMBULATORY_CARE_PROVIDER_SITE_OTHER): Payer: 59 | Admitting: Certified Nurse Midwife

## 2021-10-03 VITALS — BP 115/80 | HR 90 | Wt 233.6 lb

## 2021-10-03 DIAGNOSIS — Z3482 Encounter for supervision of other normal pregnancy, second trimester: Secondary | ICD-10-CM

## 2021-10-03 DIAGNOSIS — Z3A25 25 weeks gestation of pregnancy: Secondary | ICD-10-CM

## 2021-10-03 LAB — POCT URINALYSIS DIPSTICK OB

## 2021-10-03 NOTE — Patient Instructions (Addendum)
 1-Hour Glucose  No dessert the night before No sweet drinks the day of- soda, fruit juice, sweet tea No sweet breakfast- pancakes, donuts May have mostly protein- egg, bacon, wheat toast, black coffee.               Grilled chicken, salad, vegetable, water.       3-Hour Glucose Test  Must be fasting.  Nothing to eat or drink after midnight.  May have morning medication with a sip of water.     Common Medications Safe in Pregnancy  Acne:      Constipation:  Benzoyl Peroxide     Colace  Clindamycin      Dulcolax Suppository  Topica Erythromycin     Fibercon  Salicylic Acid      Metamucil         Miralax AVOID:        Senakot   Accutane    Cough:  Retin-A       Cough Drops  Tetracycline      Phenergan w/ Codeine if Rx  Minocycline      Robitussin (Plain & DM)  Antibiotics:     Crabs/Lice:  Ceclor       RID  Cephalosporins    AVOID:  E-Mycins      Kwell  Keflex  Macrobid/Macrodantin   Diarrhea:  Penicillin      Kao-Pectate  Zithromax      Imodium AD         PUSH FLUIDS AVOID:       Cipro     Fever:  Tetracycline      Tylenol (Regular or Extra  Minocycline       Strength)  Levaquin      Extra Strength-Do not          Exceed 8 tabs/24 hrs Caffeine:        <200mg/day (equiv. To 1 cup of coffee or  approx. 3 12 oz sodas)         Gas: Cold/Hayfever:       Gas-X  Benadryl      Mylicon  Claritin       Phazyme  **Claritin-D        Chlor-Trimeton    Headaches:  Dimetapp      ASA-Free Excedrin  Drixoral-Non-Drowsy     Cold Compress  Mucinex (Guaifenasin)     Tylenol (Regular or Extra  Sudafed/Sudafed-12 Hour     Strength)  **Sudafed PE Pseudoephedrine   Tylenol Cold & Sinus     Vicks Vapor Rub  Zyrtec  **AVOID if Problems With Blood Pressure         Heartburn: Avoid lying down for at least 1 hour after meals  Aciphex      Maalox     Rash:  Milk of Magnesia     Benadryl    Mylanta       1% Hydrocortisone Cream  Pepcid  Pepcid Complete   Sleep  Aids:  Prevacid      Ambien   Prilosec       Benadryl  Rolaids       Chamomile Tea  Tums (Limit 4/day)     Unisom         Tylenol PM         Warm milk-add vanilla or  Hemorrhoids:       Sugar for taste  Anusol/Anusol H.C.  (RX: Analapram 2.5%)  Sugar Substitutes:  Hydrocortisone OTC     Ok in moderation    Preparation H      Tucks        Vaseline lotion applied to tissue with wiping    Herpes:     Throat:  Acyclovir      Oragel  Famvir  Valtrex     Vaccines:         Flu Shot Leg Cramps:       *Gardasil  Benadryl      Hepatitis A         Hepatitis B Nasal Spray:       Pneumovax  Saline Nasal Spray     Polio Booster         Tetanus Nausea:       Tuberculosis test or PPD  Vitamin B6 25 mg TID   AVOID:    Dramamine      *Gardasil  Emetrol       Live Poliovirus  Ginger Root 250 mg QID    MMR (measles, mumps &  High Complex Carbs @ Bedtime    rebella)  Sea Bands-Accupressure    Varicella (Chickenpox)  Unisom 1/2 tab TID     *No known complications           If received before Pain:         Known pregnancy;   Darvocet       Resume series after  Lortab        Delivery  Percocet    Yeast:   Tramadol      Femstat  Tylenol 3      Gyne-lotrimin  Ultram       Monistat  Vicodin           MISC:         All Sunscreens           Hair Coloring/highlights          Insect Repellant's          (Including DEET)         Mystic Tans Oral Glucose Tolerance Test During Pregnancy Why am I having this test? The oral glucose tolerance test (OGTT) is done to check how your body processes blood sugar (glucose). This is one of several tests used to diagnose diabetes that develops during pregnancy (gestational diabetes mellitus). Gestational diabetes is a short-term form of diabetes that some women develop while they are pregnant. It usually occurs during the second trimester of pregnancy and goes away after delivery. Testing, or screening, for gestational diabetes usually occurs at weeks 24-28 of  pregnancy. You may have the OGTT test after having a 1-hour glucose screening test if the results from that test indicate that you may have gestational diabetes. This test may also be needed if: You have a history of gestational diabetes. There is a history of giving birth to very large babies or of losing pregnancies (having stillbirths). You have signs and symptoms of diabetes, such as: Changes in your eyesight. Tingling or numbness in your hands or feet. Changes in hunger, thirst, and urination, and these are not explained by your pregnancy. What is being tested? This test measures the amount of glucose in your blood at different times during a period of 3 hours. This shows how well your body can process glucose. What kind of sample is taken? Blood samples are required for this test. They are usually collected by inserting a needle into a blood vessel. How do I prepare for this test? For 3 days before your test, eat normally. Have plenty of  carbohydrate-rich foods. Follow instructions from your health care provider about: Eating or drinking restrictions on the day of the test. You may be asked not to eat or drink anything other than water (to fast) starting 8-10 hours before the test. Changing or stopping your regular medicines. Some medicines may interfere with this test. Tell a health care provider about: All medicines you are taking, including vitamins, herbs, eye drops, creams, and over-the-counter medicines. Any blood disorders you have. Any surgeries you have had. Any medical conditions you have. What happens during the test? First, your blood glucose will be measured. This is referred to as your fasting blood glucose because you fasted before the test. Then, you will drink a glucose solution that contains a certain amount of glucose. Your blood glucose will be measured again 1, 2, and 3 hours after you drink the solution. This test takes about 3 hours to complete. You will need to  stay at the testing location during this time. During the testing period: Do not eat or drink anything other than the glucose solution. Do not exercise. Do not use any products that contain nicotine or tobacco, such as cigarettes, e-cigarettes, and chewing tobacco. These can affect your test results. If you need help quitting, ask your health care provider. The testing procedure may vary among health care providers and hospitals. How are the results reported? Your results will be reported as milligrams of glucose per deciliter of blood (mg/dL) or millimoles per liter (mmol/L). There is more than one source for screening and diagnosis reference values used to diagnose gestational diabetes. Your health care provider will compare your results to normal values that were established after testing a large group of people (reference values). Reference values may vary among labs and hospitals. For this test (Carpenter-Coustan), reference values are: Fasting: 95 mg/dL (5.3 mmol/L). 1 hour: 180 mg/dL (10.0 mmol/L). 2 hour: 155 mg/dL (8.6 mmol/L). 3 hour: 140 mg/dL (7.8 mmol/L). What do the results mean? Results below the reference values are considered normal. If two or more of your blood glucose levels are at or above the reference values, you may be diagnosed with gestational diabetes. If only one level is high, your health care provider may suggest repeat testing or other tests to confirm a diagnosis. Talk with your health care provider about what your results mean. Questions to ask your health care provider Ask your health care provider, or the department that is doing the test: When will my results be ready? How will I get my results? What are my treatment options? What other tests do I need? What are my next steps? Summary The oral glucose tolerance test (OGTT) is one of several tests used to diagnose diabetes that develops during pregnancy (gestational diabetes mellitus). Gestational diabetes is a  short-term form of diabetes that some women develop while they are pregnant. You may have the OGTT test after having a 1-hour glucose screening test if the results from that test show that you may have gestational diabetes. You may also have this test if you have any symptoms or risk factors for this type of diabetes. Talk with your health care provider about what your results mean. This information is not intended to replace advice given to you by your health care provider. Make sure you discuss any questions you have with your health care provider. Document Revised: 04/18/2020 Document Reviewed: 04/18/2020 Elsevier Patient Education  2022 [employer].

## 2021-10-03 NOTE — Progress Notes (Signed)
ROB doing well. Feeling good movement. Discussed glucose screen next visit. Information sheet given. She verbalizes and agrees. Follow up 3 wks.   Doreene Burke, CNM

## 2021-10-03 NOTE — Progress Notes (Signed)
   OB-Pt present for routine prenatal care. Pt stated fetal movement present; no contractions present; no vaginal bleeding and no changes in vaginal discharge.     Pt stated that she was doing well no problems.  

## 2021-10-24 ENCOUNTER — Other Ambulatory Visit: Payer: 59

## 2021-10-24 ENCOUNTER — Other Ambulatory Visit: Payer: Self-pay

## 2021-10-24 ENCOUNTER — Ambulatory Visit (INDEPENDENT_AMBULATORY_CARE_PROVIDER_SITE_OTHER): Payer: 59 | Admitting: Certified Nurse Midwife

## 2021-10-24 VITALS — BP 112/77 | HR 84 | Wt 236.7 lb

## 2021-10-24 DIAGNOSIS — Z23 Encounter for immunization: Secondary | ICD-10-CM

## 2021-10-24 DIAGNOSIS — Z3482 Encounter for supervision of other normal pregnancy, second trimester: Secondary | ICD-10-CM | POA: Diagnosis not present

## 2021-10-24 DIAGNOSIS — Z3A28 28 weeks gestation of pregnancy: Secondary | ICD-10-CM

## 2021-10-24 DIAGNOSIS — Z3483 Encounter for supervision of other normal pregnancy, third trimester: Secondary | ICD-10-CM

## 2021-10-24 LAB — POCT URINALYSIS DIPSTICK OB
Bilirubin, UA: NEGATIVE
Blood, UA: NEGATIVE
Glucose, UA: NEGATIVE
Ketones, UA: NEGATIVE
Leukocytes, UA: NEGATIVE
Nitrite, UA: NEGATIVE
POC,PROTEIN,UA: NEGATIVE
Spec Grav, UA: 1.015 (ref 1.010–1.025)
Urobilinogen, UA: 0.2 E.U./dL
pH, UA: 7 (ref 5.0–8.0)

## 2021-10-24 MED ORDER — TETANUS-DIPHTH-ACELL PERTUSSIS 5-2.5-18.5 LF-MCG/0.5 IM SUSY
0.5000 mL | PREFILLED_SYRINGE | Freq: Once | INTRAMUSCULAR | Status: AC
  Administered 2021-10-24: 0.5 mL via INTRAMUSCULAR

## 2021-10-24 NOTE — Progress Notes (Signed)
ROB doing well. Feels good movement. 28 wk labs today: Glucose screen/RPR/CBC. Tdap ,Blood transfusion consent completed, all questions answered. Ready set baby reviewed, see check list for topics covered. Sample birth plan given, will follow up in upcoming visits. Discussed birth control after delivery, information pamphlet given.   Follow up 2 wk with MD for ROB or sooner if needed.  ( out of town)     Doreene Burke, [place]

## 2021-10-24 NOTE — Patient Instructions (Signed)
Oral Glucose Tolerance Test During Pregnancy °Why am I having this test? °The oral glucose tolerance test (OGTT) is done to check how your body processes blood sugar (glucose). This is one of several tests used to diagnose diabetes that develops during pregnancy (gestational diabetes mellitus). Gestational diabetes is a short-term form of diabetes that some women develop while they are pregnant. It usually occurs during the second trimester of pregnancy and goes away after delivery. °Testing, or screening, for gestational diabetes usually occurs at weeks 24-28 of pregnancy. You may have the OGTT test after having a 1-hour glucose screening test if the results from that test indicate that you may have gestational diabetes. This test may also be needed if: °You have a history of gestational diabetes. °There is a history of giving birth to very large babies or of losing pregnancies (having stillbirths). °You have signs and symptoms of diabetes, such as: °Changes in your eyesight. °Tingling or numbness in your hands or feet. °Changes in hunger, thirst, and urination, and these are not explained by your pregnancy. °What is being tested? °This test measures the amount of glucose in your blood at different times during a period of 3 hours. This shows how well your body can process glucose. °What kind of sample is taken? °Blood samples are required for this test. They are usually collected by inserting a needle into a blood vessel. °How do I prepare for this test? °For 3 days before your test, eat normally. Have plenty of carbohydrate-rich foods. °Follow instructions from your health care provider about: °Eating or drinking restrictions on the day of the test. You may be asked not to eat or drink anything other than water (to fast) starting 8-10 hours before the test. °Changing or stopping your regular medicines. Some medicines may interfere with this test. °Tell a health care provider about: °All medicines you are taking,  including vitamins, herbs, eye drops, creams, and over-the-counter medicines. °Any blood disorders you have. °Any surgeries you have had. °Any medical conditions you have. °What happens during the test? °First, your blood glucose will be measured. This is referred to as your fasting blood glucose because you fasted before the test. Then, you will drink a glucose solution that contains a certain amount of glucose. Your blood glucose will be measured again 1, 2, and 3 hours after you drink the solution. °This test takes about 3 hours to complete. You will need to stay at the testing location during this time. During the testing period: °Do not eat or drink anything other than the glucose solution. °Do not exercise. °Do not use any products that contain nicotine or tobacco, such as cigarettes, e-cigarettes, and chewing tobacco. These can affect your test results. If you need help quitting, ask your health care provider. °The testing procedure may vary among health care providers and hospitals. °How are the results reported? °Your results will be reported as milligrams of glucose per deciliter of blood (mg/dL) or millimoles per liter (mmol/L). There is more than one source for screening and diagnosis reference values used to diagnose gestational diabetes. Your health care provider will compare your results to normal values that were established after testing a large group of people (reference values). Reference values may vary among labs and hospitals. For this test (Carpenter-Coustan), reference values are: °Fasting: 95 mg/dL (5.3 mmol/L). °1 hour: 180 mg/dL (10.0 mmol/L). °2 hour: 155 mg/dL (8.6 mmol/L). °3 hour: 140 mg/dL (7.8 mmol/L). °What do the results mean? °Results below the reference values are considered   normal. If two or more of your blood glucose levels are at or above the reference values, you may be diagnosed with gestational diabetes. If only one level is high, your health care provider may suggest  repeat testing or other tests to confirm a diagnosis. °Talk with your health care provider about what your results mean. °Questions to ask your health care provider °Ask your health care provider, or the department that is doing the test: °When will my results be ready? °How will I get my results? °What are my treatment options? °What other tests do I need? °What are my next steps? °Summary °The oral glucose tolerance test (OGTT) is one of several tests used to diagnose diabetes that develops during pregnancy (gestational diabetes mellitus). Gestational diabetes is a short-term form of diabetes that some women develop while they are pregnant. °You may have the OGTT test after having a 1-hour glucose screening test if the results from that test show that you may have gestational diabetes. You may also have this test if you have any symptoms or risk factors for this type of diabetes. °Talk with your health care provider about what your results mean. °This information is not intended to replace advice given to you by your health care provider. Make sure you discuss any questions you have with your health care provider. °Document Revised: 04/18/2020 Document Reviewed: 04/18/2020 °Elsevier Patient Education © 2022 Elsevier Inc. ° °

## 2021-10-25 LAB — CBC
Hematocrit: 35 % (ref 34.0–46.6)
Hemoglobin: 11.4 g/dL (ref 11.1–15.9)
MCH: 30.7 pg (ref 26.6–33.0)
MCHC: 32.6 g/dL (ref 31.5–35.7)
MCV: 94 fL (ref 79–97)
Platelets: 200 10*3/uL (ref 150–450)
RBC: 3.71 x10E6/uL — ABNORMAL LOW (ref 3.77–5.28)
RDW: 12.7 % (ref 11.7–15.4)
WBC: 11 10*3/uL — ABNORMAL HIGH (ref 3.4–10.8)

## 2021-10-25 LAB — GLUCOSE, 1 HOUR GESTATIONAL: Gestational Diabetes Screen: 133 mg/dL (ref 70–139)

## 2021-10-25 LAB — RPR: RPR Ser Ql: NONREACTIVE

## 2021-11-06 ENCOUNTER — Ambulatory Visit (INDEPENDENT_AMBULATORY_CARE_PROVIDER_SITE_OTHER): Payer: 59 | Admitting: Obstetrics and Gynecology

## 2021-11-06 ENCOUNTER — Other Ambulatory Visit: Payer: Self-pay

## 2021-11-06 VITALS — BP 123/81 | HR 96 | Wt 238.7 lb

## 2021-11-06 DIAGNOSIS — Z3483 Encounter for supervision of other normal pregnancy, third trimester: Secondary | ICD-10-CM

## 2021-11-06 DIAGNOSIS — Z3A30 30 weeks gestation of pregnancy: Secondary | ICD-10-CM

## 2021-11-06 LAB — POCT URINALYSIS DIPSTICK OB
Bilirubin, UA: NEGATIVE
Blood, UA: NEGATIVE
Glucose, UA: NEGATIVE
Ketones, UA: NEGATIVE
Leukocytes, UA: NEGATIVE
Nitrite, UA: NEGATIVE
POC,PROTEIN,UA: NEGATIVE
Spec Grav, UA: 1.015 (ref 1.010–1.025)
Urobilinogen, UA: 0.2 E.U./dL
pH, UA: 7 (ref 5.0–8.0)

## 2021-11-06 NOTE — Progress Notes (Signed)
ROB: Denies problems.  1 hour GCT normal.  Daily fetal movement.  Taking prenatal vitamins and aspirin.

## 2021-11-14 DIAGNOSIS — Z3483 Encounter for supervision of other normal pregnancy, third trimester: Secondary | ICD-10-CM | POA: Diagnosis not present

## 2021-11-14 DIAGNOSIS — Z3482 Encounter for supervision of other normal pregnancy, second trimester: Secondary | ICD-10-CM | POA: Diagnosis not present

## 2021-11-23 NOTE — L&D Delivery Note (Signed)
Delivery Note   Julie Rowe is a 31 y.o. G3P1011 at [redacted]w[redacted]d Estimated Date of Delivery: 01/15/22  PRE-OPERATIVE DIAGNOSIS:  1) [redacted]w[redacted]d pregnancy.    POST-OPERATIVE DIAGNOSIS:  1) [redacted]w[redacted]d pregnancy s/p Vaginal, Spontaneous    Delivery Type: Vaginal, Spontaneous    Delivery Anesthesia: Epidural   Labor Complications:  Late decelerations in the second stage    ESTIMATED BLOOD LOSS: 300  ml    FINDINGS:   1) female infant, Apgar scores of 2   at 1 minute and 8   at 5 minutes. Birth weight pending.  SPECIMENS:   PLACENTA:   Appearance: Intact    Removal: Spontaneous      Disposition:  Per protocol  CORD BLOOD: Collected/Not Indicated  DISPOSITION:  Infant to left in stable condition in the delivery room, with L&D personnel and mother,  NARRATIVE SUMMARY: Labor course:  Julie Rowe is a G3P1011 at [redacted]w[redacted]d who presented to Labor & Delivery for induction of labor. Her initial cervical exam was 1/40/-3. Labor proceeded after 3 doses of misoprostol and she was found to be completely dilated at 1740. Recurrent late decelerations were noted with pushing. NICU attendance was requested at the birth. With excellent maternal pushing effort, Julie Rowe birthed a viable female infant at 70. A single nuchal cord was noted and was not able to be reduced. The shoulders were birthed with maternal effort. The infant was placed on Julie Rowe's chest and the infant was vigorously stimulated by the NICU team. The cord was then clamped and cut, and the baby was transported to the warmer for further assessment and resuscitation.  The placenta delivered spontaneously and was noted to be intact with a 3VC. A perineal and vaginal examination was performed. A small st degree laceration was noted; it was hemostatic and not repaired. The infant was returned to the mother's chest at approximately 5 minutes of life. Mother and baby were left in stable condition. This birth was proctored by Dr. Valentino Saxon.  Guadlupe Spanish,  CNM 01/17/2022 7:56 PM

## 2021-11-27 ENCOUNTER — Ambulatory Visit (INDEPENDENT_AMBULATORY_CARE_PROVIDER_SITE_OTHER): Payer: 59 | Admitting: Obstetrics

## 2021-11-27 ENCOUNTER — Other Ambulatory Visit: Payer: Self-pay

## 2021-11-27 VITALS — BP 135/81 | HR 92 | Wt 240.5 lb

## 2021-11-27 DIAGNOSIS — O99283 Endocrine, nutritional and metabolic diseases complicating pregnancy, third trimester: Secondary | ICD-10-CM | POA: Diagnosis not present

## 2021-11-27 DIAGNOSIS — Z3483 Encounter for supervision of other normal pregnancy, third trimester: Secondary | ICD-10-CM

## 2021-11-27 DIAGNOSIS — E039 Hypothyroidism, unspecified: Secondary | ICD-10-CM

## 2021-11-27 DIAGNOSIS — Z3A33 33 weeks gestation of pregnancy: Secondary | ICD-10-CM

## 2021-11-27 LAB — POCT URINALYSIS DIPSTICK OB
Bilirubin, UA: NEGATIVE
Blood, UA: NEGATIVE
Glucose, UA: NEGATIVE
Ketones, UA: NEGATIVE
Leukocytes, UA: NEGATIVE
Nitrite, UA: NEGATIVE
POC,PROTEIN,UA: NEGATIVE
Spec Grav, UA: 1.015 (ref 1.010–1.025)
Urobilinogen, UA: 0.2 E.U./dL
pH, UA: 6.5 (ref 5.0–8.0)

## 2021-11-27 NOTE — Progress Notes (Signed)
ROB at 33 weeks. Good fetal movement. Denies contractions, vaginal bleeding, LOF.  Has been getting short of breath easily due to baby's position, but has been able to walk and exercise. Discussed exercises and positions for opening the pelvis. Preparing for baby at home. TSH drawn today. Denies any thyroid-related symptoms. Reviewed pre-eclampsia danger signs. RTC in 2 weeks.

## 2021-11-28 ENCOUNTER — Encounter: Payer: Self-pay | Admitting: Obstetrics

## 2021-11-28 LAB — TSH: TSH: 1.36 u[IU]/mL (ref 0.450–4.500)

## 2021-12-10 ENCOUNTER — Ambulatory Visit (INDEPENDENT_AMBULATORY_CARE_PROVIDER_SITE_OTHER): Payer: 59 | Admitting: Certified Nurse Midwife

## 2021-12-10 ENCOUNTER — Encounter: Payer: Self-pay | Admitting: Certified Nurse Midwife

## 2021-12-10 ENCOUNTER — Other Ambulatory Visit: Payer: Self-pay

## 2021-12-10 VITALS — BP 116/79 | HR 98 | Wt 240.6 lb

## 2021-12-10 DIAGNOSIS — Z3A34 34 weeks gestation of pregnancy: Secondary | ICD-10-CM

## 2021-12-10 DIAGNOSIS — Z3483 Encounter for supervision of other normal pregnancy, third trimester: Secondary | ICD-10-CM

## 2021-12-10 LAB — POCT URINALYSIS DIPSTICK OB
Bilirubin, UA: NEGATIVE
Blood, UA: NEGATIVE
Glucose, UA: NEGATIVE
Ketones, UA: NEGATIVE
Leukocytes, UA: NEGATIVE
Nitrite, UA: NEGATIVE
POC,PROTEIN,UA: NEGATIVE
Spec Grav, UA: 1.015 (ref 1.010–1.025)
Urobilinogen, UA: 0.2 E.U./dL
pH, UA: 6.5 (ref 5.0–8.0)

## 2021-12-10 NOTE — Patient Instructions (Signed)

## 2021-12-10 NOTE — Progress Notes (Signed)
ROB doing well, feeling good movement. Discussed GBS swab at next visit. She verbalizes and agree. Discussed staring vaginal exam at 37 wks If she would like . She is leaning towards waiting until 38 wks. Discussed q 1 wk visit until delivery.  Follow up 1 wk with Missy.   Doreene Burke, CNM

## 2021-12-13 IMAGING — US US OB COMP LESS 14 WK
1 series · 14 of 28 positions shown · non-contrast
Comparison: None.

CLINICAL DATA: Dating, viability

EXAM:
OBSTETRIC <14 WK ULTRASOUND
TECHNIQUE: Transabdominal ultrasound was performed for evaluation of the
gestation as well as the maternal uterus and adnexal regions.

[Series 1: us ob less than 14 weeks with ob transvaginal · 14 of 61 slices shown]
[im 3/61]
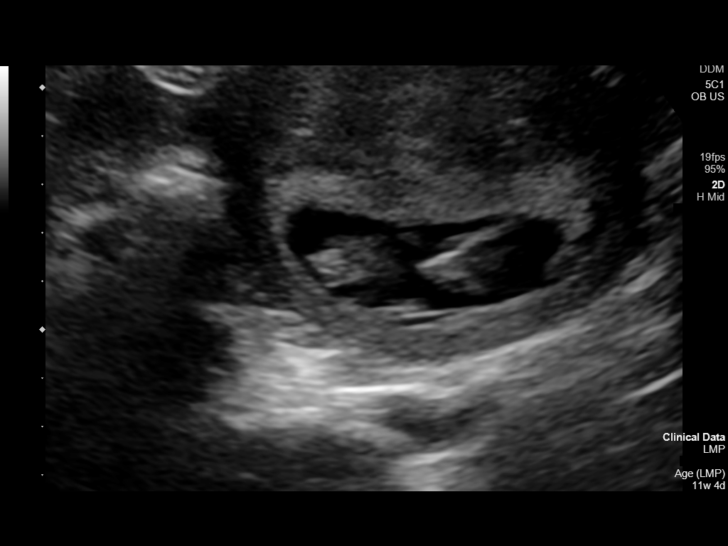
[im 7/61]
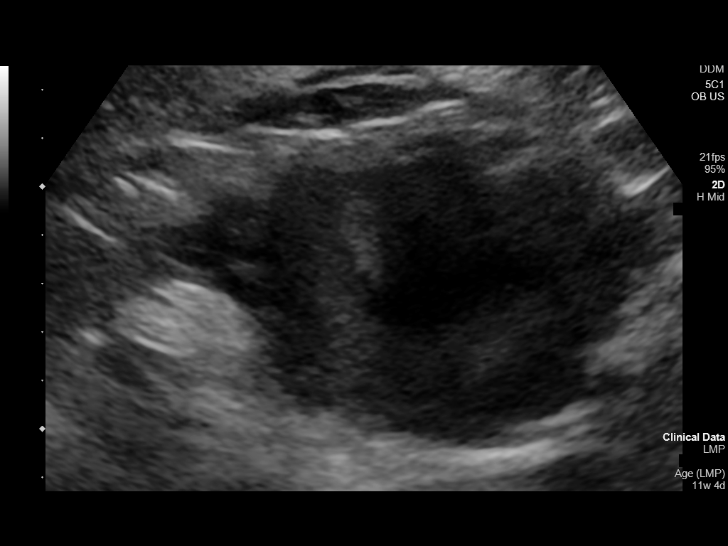
[im 12/61]
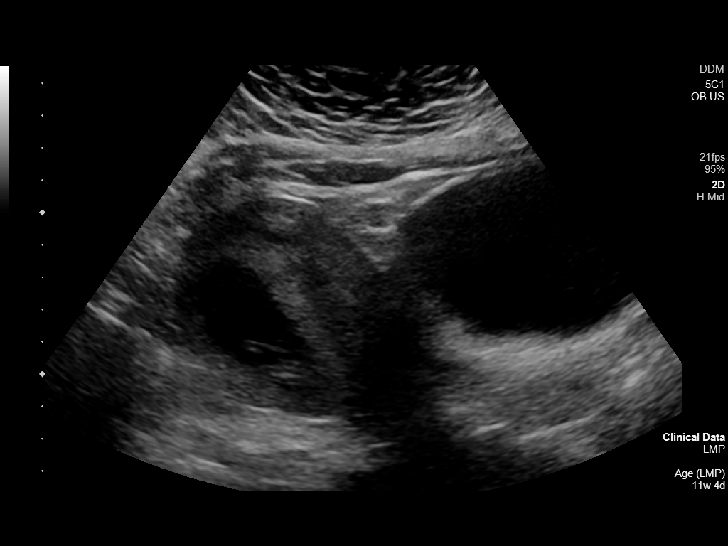
[im 16/61]
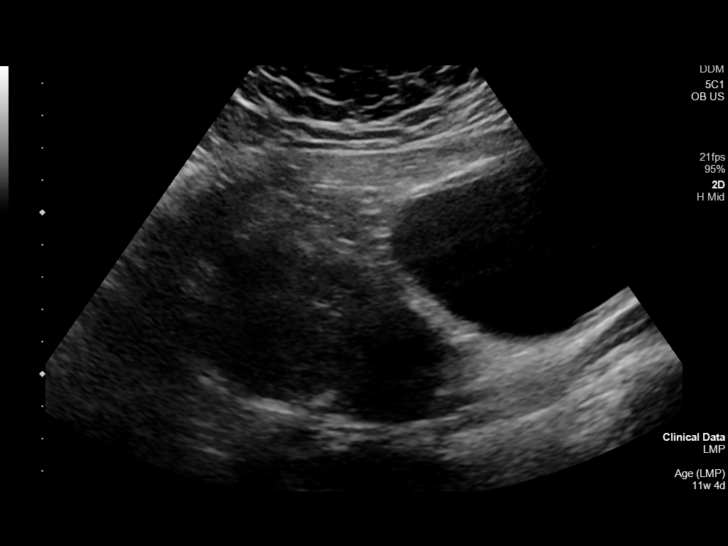
[im 21/61]
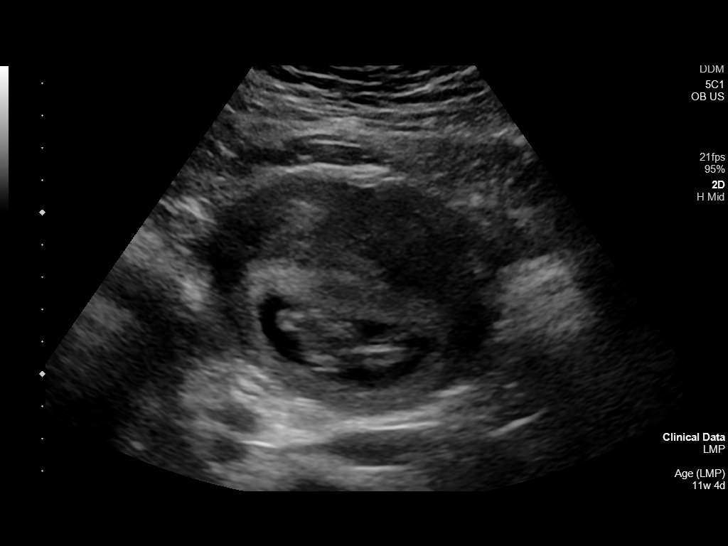
[im 25/61]
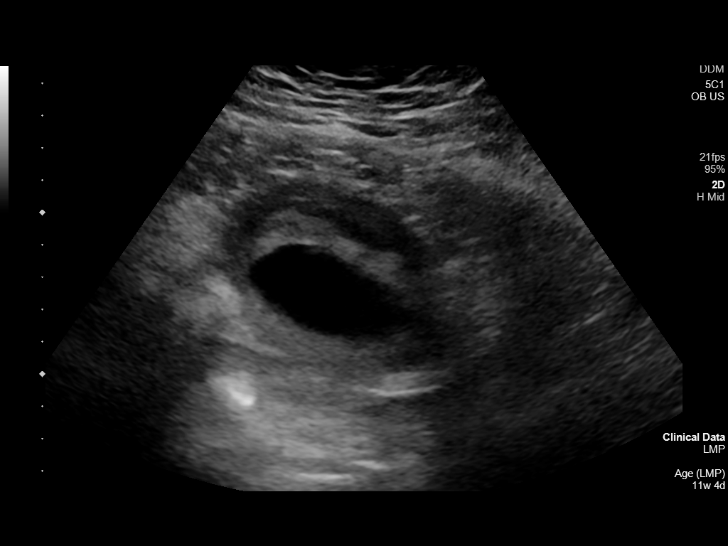
[im 29/61]
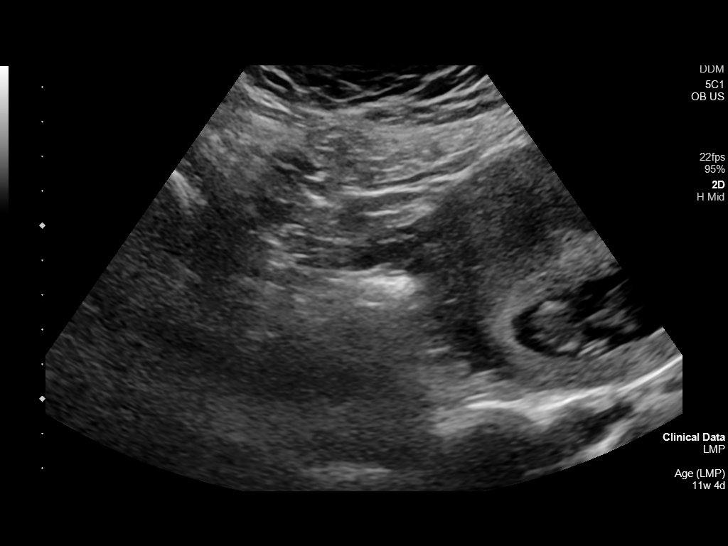
[im 34/61]
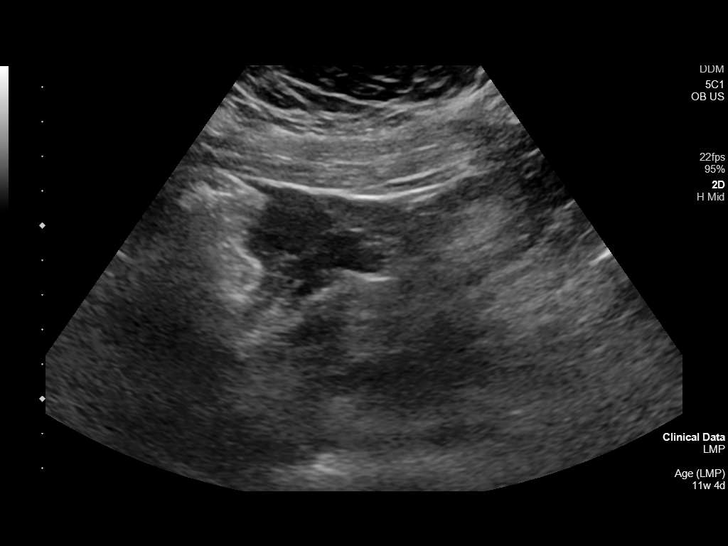
[im 38/61]
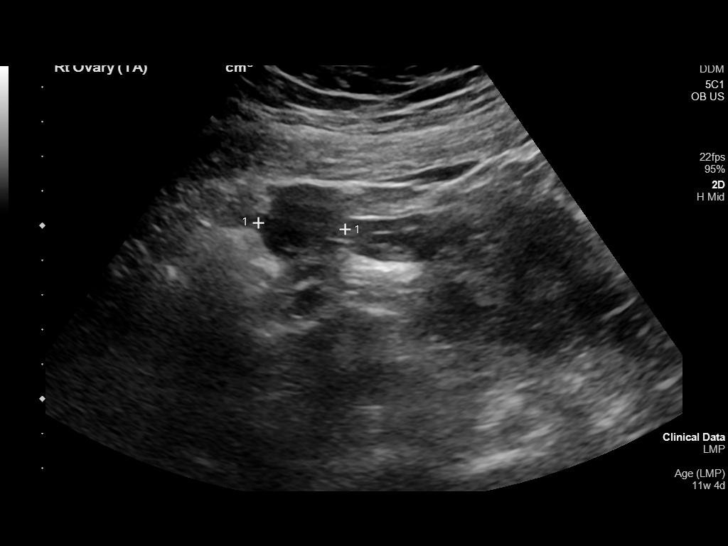
[im 43/61]
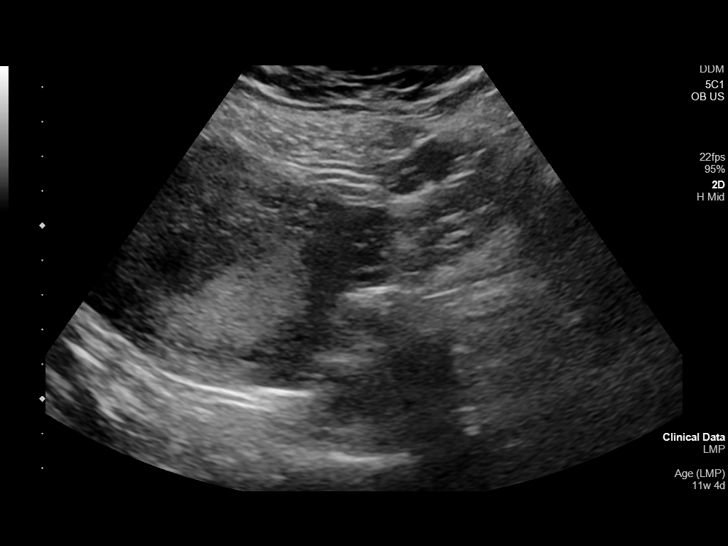
[im 47/61]
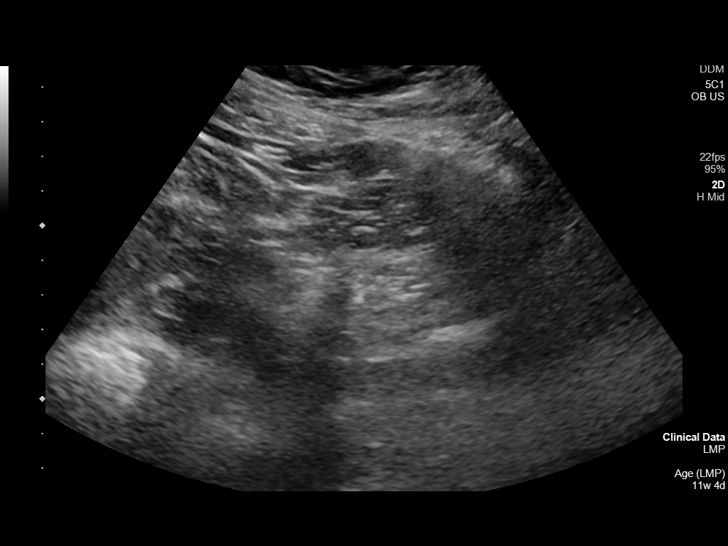
[im 52/61]
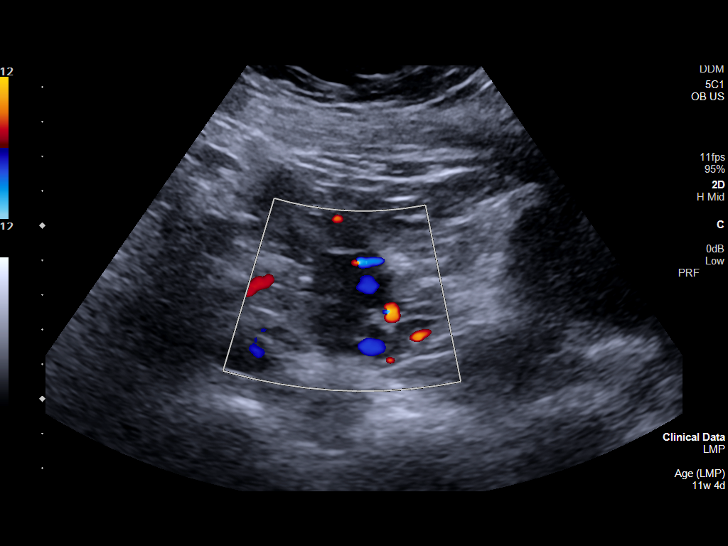
[im 56/61]
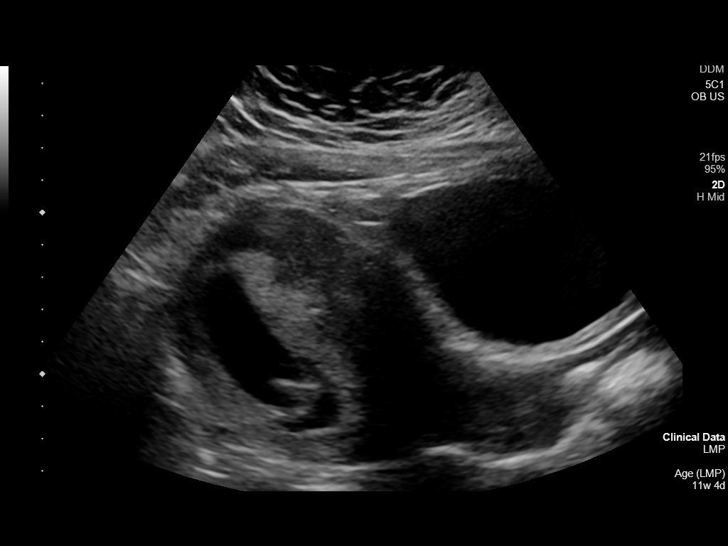
[im 61/61]
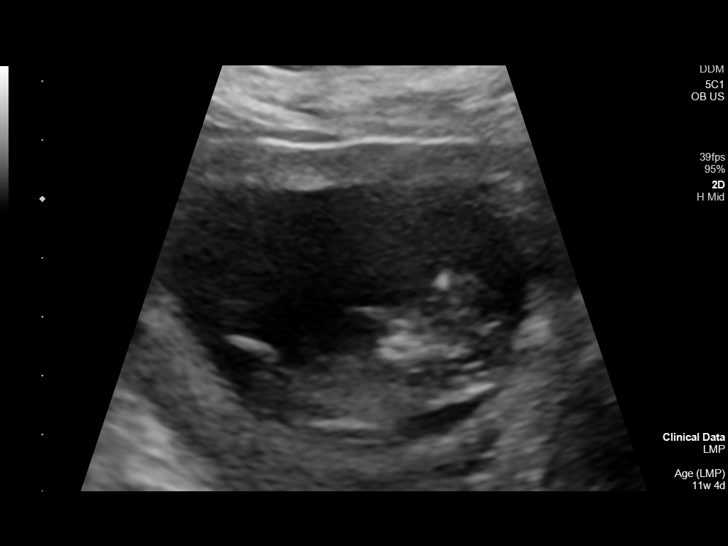

[14 of 28 positions shown; findings below may reference images not displayed]

FINDINGS: Intrauterine gestational sac: Single

Yolk sac:  Not visualized

Embryo:  Visualized

Cardiac Activity: Visualized

Heart Rate: 169 bpm

MSD:    mm    w     d

CRL:   49 mm   11 w 4 d                  US EDC: 01/15/2022

Subchorionic hemorrhage:  None visualized.

Maternal uterus/adnexae: No adnexal mass or free fluid.
IMPRESSION: Eleven week 4 day intrauterine pregnancy. Fetal heart rate 169 beats
per minute. No acute maternal findings.

## 2021-12-19 ENCOUNTER — Ambulatory Visit (INDEPENDENT_AMBULATORY_CARE_PROVIDER_SITE_OTHER): Payer: 59 | Admitting: Obstetrics

## 2021-12-19 ENCOUNTER — Other Ambulatory Visit: Payer: Self-pay

## 2021-12-19 ENCOUNTER — Encounter: Payer: Self-pay | Admitting: Obstetrics

## 2021-12-19 VITALS — BP 124/80 | HR 93 | Wt 243.2 lb

## 2021-12-19 DIAGNOSIS — Z3A36 36 weeks gestation of pregnancy: Secondary | ICD-10-CM

## 2021-12-19 DIAGNOSIS — Z3483 Encounter for supervision of other normal pregnancy, third trimester: Secondary | ICD-10-CM | POA: Diagnosis not present

## 2021-12-19 LAB — POCT URINALYSIS DIPSTICK OB
Bilirubin, UA: NEGATIVE
Blood, UA: NEGATIVE
Ketones, UA: NEGATIVE
Leukocytes, UA: NEGATIVE
Nitrite, UA: NEGATIVE
Spec Grav, UA: 1.015 (ref 1.010–1.025)
Urobilinogen, UA: 0.2 E.U./dL
pH, UA: 7 (ref 5.0–8.0)

## 2021-12-19 NOTE — Progress Notes (Signed)
ROB: She is doing well, no new concerns. GBS and G/C done today.

## 2021-12-19 NOTE — Progress Notes (Signed)
ROB at [redacted]w[redacted]d. Active baby. Denies ctx, vaginal bleeding, LOF. Discussed comfort measures for swelling. Car seat installed in car. Baby will see [facility]. Feels OP today; given [employer]. ROB in one week.  Lloyd Huger, CNM

## 2021-12-21 LAB — STREP GP B NAA: Strep Gp B NAA: NEGATIVE

## 2021-12-21 LAB — GC/CHLAMYDIA PROBE AMP
Chlamydia trachomatis, NAA: NEGATIVE
Neisseria Gonorrhoeae by PCR: NEGATIVE

## 2021-12-23 ENCOUNTER — Encounter: Payer: Self-pay | Admitting: Obstetrics

## 2021-12-24 ENCOUNTER — Encounter: Payer: Self-pay | Admitting: Certified Nurse Midwife

## 2021-12-24 ENCOUNTER — Ambulatory Visit (INDEPENDENT_AMBULATORY_CARE_PROVIDER_SITE_OTHER): Payer: 59 | Admitting: Certified Nurse Midwife

## 2021-12-24 ENCOUNTER — Other Ambulatory Visit: Payer: Self-pay

## 2021-12-24 VITALS — BP 130/87 | HR 88 | Wt 243.9 lb

## 2021-12-24 DIAGNOSIS — Z3483 Encounter for supervision of other normal pregnancy, third trimester: Secondary | ICD-10-CM

## 2021-12-24 DIAGNOSIS — Z3A36 36 weeks gestation of pregnancy: Secondary | ICD-10-CM

## 2021-12-24 LAB — POCT URINALYSIS DIPSTICK OB
Bilirubin, UA: NEGATIVE
Blood, UA: NEGATIVE
Glucose, UA: NEGATIVE
Ketones, UA: NEGATIVE
Leukocytes, UA: NEGATIVE
Nitrite, UA: NEGATIVE
POC,PROTEIN,UA: NEGATIVE
Spec Grav, UA: 1.02 (ref 1.010–1.025)
Urobilinogen, UA: 0.2 E.U./dL
pH, UA: 7 (ref 5.0–8.0)

## 2021-12-24 NOTE — Patient Instructions (Signed)
Braxton Hicks Contractions Contractions of the uterus can occur throughout pregnancy, but they are not always a sign that you are in labor. You may have practice contractions called Braxton Hicks contractions. These false labor contractions are sometimes confused with true labor. What are Braxton Hicks contractions? Braxton Hicks contractions are tightening movements that occur in the muscles of the uterus before labor. Unlike true labor contractions, these contractions do not result in opening (dilation) and thinning of the lowest part of the uterus (cervix). Toward the end of pregnancy (32-34 weeks), Braxton Hicks contractions can happen more often and may become stronger. These contractions are sometimes difficult to tell apart from true labor because they can be very uncomfortable. How to tell the difference between true labor and false labor True labor Contractions last 30-70 seconds. Contractions become very regular. Discomfort is usually felt in the top of the uterus, and it spreads to the lower abdomen and low back. Contractions do not go away with walking. Contractions usually become stronger and more frequent. The cervix dilates and gets thinner. False labor Contractions are usually shorter, weaker, and farther apart than true labor contractions. Contractions are usually irregular. Contractions are often felt in the front of the lower abdomen and in the groin. Contractions may go away when you walk around or change positions while lying down. The cervix usually does not dilate or become thin. Sometimes, the only way to tell if you are in true labor is for your health care provider to look for changes in your cervix. Your health care provider will do a physical exam and may monitor your contractions. If you are in true labor, your health care provider will send you home with instructions about when to return to the hospital. You may continue to have Braxton Hicks contractions until you  go into true labor. Follow these instructions at home:  Take over-the-counter and prescription medicines only as told by your health care provider. If Braxton Hicks contractions are making you uncomfortable: Change your position from lying down or resting to walking, or change from walking to resting. Sit and rest in a tub of warm water. Drink enough fluid to keep your urine pale yellow. Dehydration may cause these contractions. Do slow and deep breathing several times an hour. Keep all follow-up visits. This is important. Contact a health care provider if: You have a fever. You have continuous pain in your abdomen. Your contractions become stronger, more regular, and closer together. You pass blood-tinged mucus. Get help right away if: You have fluid leaking or gushing from your vagina. You have bright red blood coming from your vagina. Your baby is not moving inside you as much as it used to. Summary You may have practice contractions called Braxton Hicks contractions. These false labor contractions are sometimes confused with true labor. Braxton Hicks contractions are usually shorter, weaker, farther apart, and less regular than true labor contractions. True labor contractions usually become stronger, more regular, and more frequent. Manage discomfort from Braxton Hicks contractions by changing position, resting in a warm bath, practicing deep breathing, and drinking plenty of water. Keep all follow-up visits. Contact your health care provider if your contractions become stronger, more regular, and closer together. This information is not intended to replace advice given to you by your health care provider. Make sure you discuss any questions you have with your health care provider. Document Revised: 09/16/2020 Document Reviewed: 09/16/2020 Elsevier Patient Education  2022 Elsevier Inc.  

## 2021-12-24 NOTE — Progress Notes (Signed)
ROB doing well, feeling good movement. Plan on SVE next visit. Reviewed labor precautions . Discussed GBS results negative. Encourage pt to continue miles circuit and herbal prep. Reviewed birth plan. Copy scanned to chart. Follow up 1 wk with Missy.   Doreene Burke, CNM

## 2022-01-02 ENCOUNTER — Ambulatory Visit (INDEPENDENT_AMBULATORY_CARE_PROVIDER_SITE_OTHER): Payer: 59 | Admitting: Obstetrics

## 2022-01-02 ENCOUNTER — Other Ambulatory Visit: Payer: Self-pay

## 2022-01-02 VITALS — BP 136/81 | HR 92 | Wt 242.2 lb

## 2022-01-02 DIAGNOSIS — Z3483 Encounter for supervision of other normal pregnancy, third trimester: Secondary | ICD-10-CM

## 2022-01-02 DIAGNOSIS — Z3A38 38 weeks gestation of pregnancy: Secondary | ICD-10-CM

## 2022-01-02 LAB — POCT URINALYSIS DIPSTICK OB
Bilirubin, UA: NEGATIVE
Blood, UA: NEGATIVE
Glucose, UA: NEGATIVE
Ketones, UA: NEGATIVE
Leukocytes, UA: NEGATIVE
Nitrite, UA: NEGATIVE
POC,PROTEIN,UA: NEGATIVE
Spec Grav, UA: 1.015 (ref 1.010–1.025)
Urobilinogen, UA: 0.2 E.U./dL
pH, UA: 6.5 (ref 5.0–8.0)

## 2022-01-02 NOTE — Progress Notes (Signed)
ROB at [redacted]w[redacted]d. Active baby. Denies ctx, LOF, vaginal bleeding. Ready for labor. Still doing EPO, [employer], RRL tea. Desires IOL by 41 weeks. Discussed NST at 40 weeks. Requested SVE today: 1/20/ballotable. Reviewed pre-e danger signs. RTC in one week.  Julie Rowe, CNM

## 2022-01-07 ENCOUNTER — Other Ambulatory Visit: Payer: Self-pay

## 2022-01-07 ENCOUNTER — Encounter: Payer: Self-pay | Admitting: Certified Nurse Midwife

## 2022-01-07 ENCOUNTER — Ambulatory Visit (INDEPENDENT_AMBULATORY_CARE_PROVIDER_SITE_OTHER): Payer: 59 | Admitting: Certified Nurse Midwife

## 2022-01-07 VITALS — BP 131/85 | HR 83 | Wt 245.1 lb

## 2022-01-07 DIAGNOSIS — Z3A38 38 weeks gestation of pregnancy: Secondary | ICD-10-CM

## 2022-01-07 DIAGNOSIS — Z3483 Encounter for supervision of other normal pregnancy, third trimester: Secondary | ICD-10-CM

## 2022-01-07 LAB — POCT URINALYSIS DIPSTICK OB
Bilirubin, UA: NEGATIVE
Blood, UA: NEGATIVE
Glucose, UA: NEGATIVE
Ketones, UA: NEGATIVE
Leukocytes, UA: NEGATIVE
Nitrite, UA: NEGATIVE
POC,PROTEIN,UA: NEGATIVE
Spec Grav, UA: 1.02 (ref 1.010–1.025)
Urobilinogen, UA: 0.2 E.U./dL
pH, UA: 7.5 (ref 5.0–8.0)

## 2022-01-07 NOTE — Progress Notes (Signed)
ROB doing well, Feels good movement. Discussed NST next with at 40 wks she agrees. Discussed induction 41 wks if she does not go into labor , she is in agreement. Declines SVE today. Labor precautions reviewed BP 135/89 , 131/85 she denies any symptoms of pre E.. Reviewed red flag symptoms Follow up 1 wk for NST and ROB with Missy.  Doreene Burke, CNM

## 2022-01-08 ENCOUNTER — Encounter: Payer: Self-pay | Admitting: Obstetrics and Gynecology

## 2022-01-08 ENCOUNTER — Observation Stay
Admission: EM | Admit: 2022-01-08 | Discharge: 2022-01-08 | Disposition: A | Discharge status: Home / Self Care | Payer: 59 | Attending: Obstetrics | Admitting: Obstetrics

## 2022-01-08 ENCOUNTER — Other Ambulatory Visit: Payer: Self-pay

## 2022-01-08 ENCOUNTER — Telehealth: Payer: Self-pay | Admitting: Certified Nurse Midwife

## 2022-01-08 DIAGNOSIS — O26893 Other specified pregnancy related conditions, third trimester: Secondary | ICD-10-CM | POA: Diagnosis not present

## 2022-01-08 DIAGNOSIS — Z3A39 39 weeks gestation of pregnancy: Secondary | ICD-10-CM | POA: Insufficient documentation

## 2022-01-08 DIAGNOSIS — H538 Other visual disturbances: Secondary | ICD-10-CM | POA: Diagnosis not present

## 2022-01-08 DIAGNOSIS — O99891 Other specified diseases and conditions complicating pregnancy: Secondary | ICD-10-CM | POA: Insufficient documentation

## 2022-01-08 DIAGNOSIS — R519 Headache, unspecified: Secondary | ICD-10-CM | POA: Diagnosis not present

## 2022-01-08 LAB — COMPREHENSIVE METABOLIC PANEL
ALT: 10 U/L (ref 0–44)
AST: 14 U/L — ABNORMAL LOW (ref 15–41)
Albumin: 2.6 g/dL — ABNORMAL LOW (ref 3.5–5.0)
Alkaline Phosphatase: 105 U/L (ref 38–126)
Anion gap: 5 (ref 5–15)
BUN: 8 mg/dL (ref 6–20)
CO2: 21 mmol/L — ABNORMAL LOW (ref 22–32)
Calcium: 8.6 mg/dL — ABNORMAL LOW (ref 8.9–10.3)
Chloride: 108 mmol/L (ref 98–111)
Creatinine, Ser: 0.53 mg/dL (ref 0.44–1.00)
GFR, Estimated: >60 mL/min (ref >60)
Glucose, Bld: 110 mg/dL — ABNORMAL HIGH (ref 70–99)
Potassium: 3.3 mmol/L — ABNORMAL LOW (ref 3.5–5.1)
Sodium: 134 mmol/L — ABNORMAL LOW (ref 135–145)
Total Bilirubin: 0.5 mg/dL (ref 0.3–1.2)
Total Protein: 5.9 g/dL — ABNORMAL LOW (ref 6.5–8.1)

## 2022-01-08 LAB — CBC
HCT: 30.3 % — ABNORMAL LOW (ref 36.0–46.0)
Hemoglobin: 9.8 g/dL — ABNORMAL LOW (ref 12.0–15.0)
MCH: 29 pg (ref 26.0–34.0)
MCHC: 32.3 g/dL (ref 30.0–36.0)
MCV: 89.6 fL (ref 80.0–100.0)
Platelets: 175 10*3/uL (ref 150–400)
RBC: 3.38 MIL/uL — ABNORMAL LOW (ref 3.87–5.11)
RDW: 14.1 % (ref 11.5–15.5)
WBC: 10.6 10*3/uL — ABNORMAL HIGH (ref 4.0–10.5)
nRBC: 0 % (ref 0.0–0.2)

## 2022-01-08 LAB — PROTEIN / CREATININE RATIO, URINE
Creatinine, Urine: 41 mg/dL
Total Protein, Urine: <6 mg/dL

## 2022-01-08 NOTE — OB Triage Note (Signed)
Pt G3P1 39w presents with c/o HA since this AM along with seeing floaters. +FM. Denies LOF,bleeding,ctx. BP cycling.

## 2022-01-08 NOTE — OB Triage Note (Addendum)
LABOR & DELIVERY OB TRIAGE NOTE  SUBJECTIVE  HPI Julie Rowe is a 31 y.o. G3P1011 at [redacted]w[redacted]d who presents to Labor & Delivery for headache and visual changes. She reports that she has had a headache that started around 0900 and has come and gone throughout the day. She got some relief with Tylenol, but it did not go away completely. She reports seeing some floaters today.  OB History     Gravida  3   Para  1   Term  1   Preterm  0   AB  1   Living  1      SAB  1   IAB  0   Ectopic  0   Multiple  0   Live Births  1          Lab Orders         CBC         Comprehensive metabolic panel         Protein / creatinine ratio, urine       OBJECTIVE  BP 126/80    Pulse 82    Temp 98.9 F (37.2 C) (Oral)    Resp 15    Ht 5\' 6"  (1.676 m)    Wt 111 kg    LMP 04/10/2021 (Exact Date)    BMI 39.50 kg/m   General: Oriented, alert, in no acute distress. Cardiovascular: RRR, no murmur Lungs: CTAB Abdomen: Gravid. Non-tender. No epigastric pain. No pain with palpation. DTRs: 2+. No clonus. Extremities: No edema or tenderness.  NST I reviewed the NST and it was reactive.  Baseline: 130 Variability: moderate Accelerations: present - 15x15 Decelerations:none Toco: occasional contractions Category 1  CBC, CMP, UP:C all WNL (see results)  ASSESSMENT Impression  1) Pregnancy at G3P1011, [redacted]w[redacted]d, Estimated Date of Delivery: 01/15/22 2) Reassuring maternal/fetal status 3) Labs WNL, no elevated BPs 4) No s/s of pre-eclampsia  PLAN  1) Reviewed comfort measures for headache 2) Discharge home with standard labor and pre-eclampsia precations 3) Keeps scheduled ROB  01/17/22, CNM

## 2022-01-08 NOTE — Telephone Encounter (Signed)
Pt called and stated that she was advised to let physician know if she begin to have headaches. Pt stated she has a terrible migraine this morning. Please advise.

## 2022-01-08 NOTE — Progress Notes (Signed)
Patient discharged to home. Patient spouse at bedside. Pt left unit ambulatory with all personal belongings to be driven home in private passenger vehicle by spouse. Pt verbalized understanding of d/c instructions and s/s of PIH and labor precautions to return. Patient is thankful for care and encouraged to keep all outpatient OB Non stress tests and visits. OB triage complete. RN, Sign off on care.

## 2022-01-09 ENCOUNTER — Other Ambulatory Visit: Payer: Self-pay | Admitting: Certified Nurse Midwife

## 2022-01-09 DIAGNOSIS — R519 Headache, unspecified: Secondary | ICD-10-CM

## 2022-01-09 DIAGNOSIS — O26893 Other specified pregnancy related conditions, third trimester: Secondary | ICD-10-CM

## 2022-01-15 ENCOUNTER — Other Ambulatory Visit: Payer: 59

## 2022-01-15 ENCOUNTER — Other Ambulatory Visit: Payer: Self-pay | Admitting: Obstetrics

## 2022-01-15 ENCOUNTER — Other Ambulatory Visit
Admission: RE | Admit: 2022-01-15 | Discharge: 2022-01-15 | Disposition: A | Discharge status: Home / Self Care | Payer: 59 | Source: Ambulatory Visit | Attending: Certified Nurse Midwife | Admitting: Certified Nurse Midwife

## 2022-01-15 ENCOUNTER — Other Ambulatory Visit: Payer: Self-pay

## 2022-01-15 ENCOUNTER — Ambulatory Visit (INDEPENDENT_AMBULATORY_CARE_PROVIDER_SITE_OTHER): Payer: 59 | Admitting: Obstetrics

## 2022-01-15 VITALS — BP 134/84 | HR 85 | Wt 241.5 lb

## 2022-01-15 DIAGNOSIS — Z3483 Encounter for supervision of other normal pregnancy, third trimester: Secondary | ICD-10-CM

## 2022-01-15 DIAGNOSIS — Z01812 Encounter for preprocedural laboratory examination: Secondary | ICD-10-CM | POA: Insufficient documentation

## 2022-01-15 DIAGNOSIS — Z3A40 40 weeks gestation of pregnancy: Secondary | ICD-10-CM | POA: Diagnosis not present

## 2022-01-15 DIAGNOSIS — Z20822 Contact with and (suspected) exposure to covid-19: Secondary | ICD-10-CM | POA: Insufficient documentation

## 2022-01-15 NOTE — Progress Notes (Signed)
ROB at 40w. Active baby. Having some runs of contractions. Denies LOF and vaginal bleeding. Reactive NST today (see note). No more HA since hospital visit. Would like IOL before 41 weeks. Scheduled for Saturday, 2/24. Knows to go to the hospital if she labors or has HA symptoms before then.   Guadlupe Spanish

## 2022-01-16 ENCOUNTER — Other Ambulatory Visit: Payer: Self-pay | Admitting: Obstetrics

## 2022-01-16 LAB — SARS CORONAVIRUS 2 (TAT 6-24 HRS): SARS Coronavirus 2: NEGATIVE

## 2022-01-17 ENCOUNTER — Inpatient Hospital Stay
Admission: EM | Admit: 2022-01-17 | Discharge: 2022-01-19 | DRG: 807 | Disposition: A | Discharge status: Home / Self Care | Payer: 59 | Attending: Obstetrics | Admitting: Obstetrics

## 2022-01-17 ENCOUNTER — Encounter: Payer: Self-pay | Admitting: Obstetrics

## 2022-01-17 ENCOUNTER — Inpatient Hospital Stay: Payer: 59 | Admitting: Anesthesiology

## 2022-01-17 ENCOUNTER — Other Ambulatory Visit: Payer: Self-pay

## 2022-01-17 DIAGNOSIS — E038 Other specified hypothyroidism: Secondary | ICD-10-CM | POA: Diagnosis present

## 2022-01-17 DIAGNOSIS — O99284 Endocrine, nutritional and metabolic diseases complicating childbirth: Secondary | ICD-10-CM | POA: Diagnosis not present

## 2022-01-17 DIAGNOSIS — E039 Hypothyroidism, unspecified: Secondary | ICD-10-CM | POA: Diagnosis not present

## 2022-01-17 DIAGNOSIS — Z3A40 40 weeks gestation of pregnancy: Secondary | ICD-10-CM | POA: Diagnosis not present

## 2022-01-17 DIAGNOSIS — Z20822 Contact with and (suspected) exposure to covid-19: Secondary | ICD-10-CM | POA: Diagnosis not present

## 2022-01-17 DIAGNOSIS — O480 Post-term pregnancy: Principal | ICD-10-CM | POA: Diagnosis present

## 2022-01-17 DIAGNOSIS — E669 Obesity, unspecified: Secondary | ICD-10-CM | POA: Diagnosis not present

## 2022-01-17 DIAGNOSIS — O99210 Obesity complicating pregnancy, unspecified trimester: Secondary | ICD-10-CM | POA: Diagnosis present

## 2022-01-17 DIAGNOSIS — E063 Autoimmune thyroiditis: Secondary | ICD-10-CM | POA: Diagnosis not present

## 2022-01-17 DIAGNOSIS — O99214 Obesity complicating childbirth: Secondary | ICD-10-CM | POA: Diagnosis present

## 2022-01-17 LAB — TYPE AND SCREEN
ABO/RH(D): A POS
Antibody Screen: NEGATIVE

## 2022-01-17 LAB — COMPREHENSIVE METABOLIC PANEL
ALT: 11 U/L (ref 0–44)
AST: 16 U/L (ref 15–41)
Albumin: 2.9 g/dL — ABNORMAL LOW (ref 3.5–5.0)
Alkaline Phosphatase: 129 U/L — ABNORMAL HIGH (ref 38–126)
Anion gap: 10 (ref 5–15)
BUN: 8 mg/dL (ref 6–20)
CO2: 20 mmol/L — ABNORMAL LOW (ref 22–32)
Calcium: 9.5 mg/dL (ref 8.9–10.3)
Chloride: 105 mmol/L (ref 98–111)
Creatinine, Ser: 0.63 mg/dL (ref 0.44–1.00)
GFR, Estimated: >60 mL/min (ref >60)
Glucose, Bld: 96 mg/dL (ref 70–99)
Potassium: 3.8 mmol/L (ref 3.5–5.1)
Sodium: 135 mmol/L (ref 135–145)
Total Bilirubin: 0.5 mg/dL (ref 0.3–1.2)
Total Protein: 6.6 g/dL (ref 6.5–8.1)

## 2022-01-17 LAB — PROTEIN / CREATININE RATIO, URINE
Creatinine, Urine: 56 mg/dL
Protein Creatinine Ratio: 0.14 mg/mg{Cre} (ref 0.00–0.15)
Total Protein, Urine: 8 mg/dL

## 2022-01-17 LAB — CBC
HCT: 32.8 % — ABNORMAL LOW (ref 36.0–46.0)
Hemoglobin: 10.9 g/dL — ABNORMAL LOW (ref 12.0–15.0)
MCH: 29.4 pg (ref 26.0–34.0)
MCHC: 33.2 g/dL (ref 30.0–36.0)
MCV: 88.4 fL (ref 80.0–100.0)
Platelets: 187 10*3/uL (ref 150–400)
RBC: 3.71 MIL/uL — ABNORMAL LOW (ref 3.87–5.11)
RDW: 14.5 % (ref 11.5–15.5)
WBC: 11.8 10*3/uL — ABNORMAL HIGH (ref 4.0–10.5)
nRBC: 0 % (ref 0.0–0.2)

## 2022-01-17 LAB — RPR: RPR Ser Ql: NONREACTIVE

## 2022-01-17 MED ORDER — PHENYLEPHRINE 40 MCG/ML (10ML) SYRINGE FOR IV PUSH (FOR BLOOD PRESSURE SUPPORT)
80.0000 ug | PREFILLED_SYRINGE | INTRAVENOUS | Status: DC | PRN
  Filled 2022-01-17: qty 10

## 2022-01-17 MED ORDER — MISOPROSTOL 200 MCG PO TABS
ORAL_TABLET | ORAL | Status: AC
  Filled 2022-01-17: qty 4

## 2022-01-17 MED ORDER — OXYTOCIN BOLUS FROM INFUSION
333.0000 mL | Freq: Once | INTRAVENOUS | Status: AC
  Administered 2022-01-17: 333 mL via INTRAVENOUS

## 2022-01-17 MED ORDER — TETANUS-DIPHTH-ACELL PERTUSSIS 5-2.5-18.5 LF-MCG/0.5 IM SUSY
0.5000 mL | PREFILLED_SYRINGE | Freq: Once | INTRAMUSCULAR | Status: DC
  Filled 2022-01-17: qty 0.5

## 2022-01-17 MED ORDER — OXYTOCIN-SODIUM CHLORIDE 30-0.9 UT/500ML-% IV SOLN
2.5000 [IU]/h | INTRAVENOUS | Status: DC
  Administered 2022-01-17: 2.5 [IU]/h via INTRAVENOUS

## 2022-01-17 MED ORDER — IBUPROFEN 600 MG PO TABS
ORAL_TABLET | ORAL | Status: AC
  Administered 2022-01-17: 600 mg
  Filled 2022-01-17: qty 1

## 2022-01-17 MED ORDER — DOCUSATE SODIUM 100 MG PO CAPS
100.0000 mg | ORAL_CAPSULE | Freq: Two times a day (BID) | ORAL | Status: DC
Start: 2022-01-17 — End: 2022-01-19
  Administered 2022-01-17 – 2022-01-19 (×4): 100 mg via ORAL
  Filled 2022-01-17 (×4): qty 1

## 2022-01-17 MED ORDER — BUTORPHANOL TARTRATE 1 MG/ML IJ SOLN
1.0000 mg | INTRAMUSCULAR | Status: DC | PRN
  Administered 2022-01-17: 1 mg via INTRAVENOUS
  Filled 2022-01-17: qty 1

## 2022-01-17 MED ORDER — METHYLERGONOVINE MALEATE 0.2 MG PO TABS
0.2000 mg | ORAL_TABLET | ORAL | Status: DC | PRN
  Filled 2022-01-17: qty 1

## 2022-01-17 MED ORDER — COCONUT OIL OIL
1.0000 "application " | TOPICAL_OIL | Status: DC | PRN
  Filled 2022-01-17: qty 120

## 2022-01-17 MED ORDER — OXYTOCIN-SODIUM CHLORIDE 30-0.9 UT/500ML-% IV SOLN
INTRAVENOUS | Status: AC
  Filled 2022-01-17: qty 1000

## 2022-01-17 MED ORDER — METHYLERGONOVINE MALEATE 0.2 MG/ML IJ SOLN
0.2000 mg | INTRAMUSCULAR | Status: DC | PRN
  Filled 2022-01-17: qty 1

## 2022-01-17 MED ORDER — DIPHENHYDRAMINE HCL 50 MG/ML IJ SOLN: 12.5000 mg | INTRAMUSCULAR | Status: DC | PRN

## 2022-01-17 MED ORDER — ACETAMINOPHEN 325 MG PO TABS: 650.0000 mg | ORAL_TABLET | ORAL | Status: DC | PRN

## 2022-01-17 MED ORDER — OXYTOCIN 10 UNIT/ML IJ SOLN
INTRAMUSCULAR | Status: AC
  Filled 2022-01-17: qty 2

## 2022-01-17 MED ORDER — LIDOCAINE HCL (PF) 1 % IJ SOLN: 30.0000 mL | INTRAMUSCULAR | Status: DC | PRN

## 2022-01-17 MED ORDER — FENTANYL-BUPIVACAINE-NACL 0.5-0.125-0.9 MG/250ML-% EP SOLN
EPIDURAL | Status: AC
  Filled 2022-01-17: qty 250

## 2022-01-17 MED ORDER — LACTATED RINGERS IV SOLN: 500.0000 mL | INTRAVENOUS | Status: DC | PRN

## 2022-01-17 MED ORDER — OXYTOCIN-SODIUM CHLORIDE 30-0.9 UT/500ML-% IV SOLN: 1.0000 m[IU]/min | INTRAVENOUS | Status: DC

## 2022-01-17 MED ORDER — MISOPROSTOL 50MCG HALF TABLET
50.0000 ug | ORAL_TABLET | ORAL | Status: DC | PRN
  Administered 2022-01-17 (×3): 50 ug via VAGINAL
  Filled 2022-01-17 (×2): qty 1

## 2022-01-17 MED ORDER — AMMONIA AROMATIC IN INHA
RESPIRATORY_TRACT | Status: AC
  Filled 2022-01-17: qty 10

## 2022-01-17 MED ORDER — HYDROXYZINE HCL 25 MG PO TABS
50.0000 mg | ORAL_TABLET | Freq: Four times a day (QID) | ORAL | Status: DC | PRN
  Filled 2022-01-17: qty 1

## 2022-01-17 MED ORDER — ONDANSETRON HCL 4 MG/2ML IJ SOLN
4.0000 mg | Freq: Four times a day (QID) | INTRAMUSCULAR | Status: DC | PRN
  Administered 2022-01-17: 4 mg via INTRAVENOUS
  Filled 2022-01-17: qty 2

## 2022-01-17 MED ORDER — PRENATAL MULTIVITAMIN CH: 1.0000 | ORAL_TABLET | Freq: Every day | ORAL | Status: DC

## 2022-01-17 MED ORDER — OXYTOCIN-SODIUM CHLORIDE 30-0.9 UT/500ML-% IV SOLN: 2.5000 [IU]/h | INTRAVENOUS | Status: DC | PRN

## 2022-01-17 MED ORDER — IBUPROFEN 600 MG PO TABS: 600.0000 mg | ORAL_TABLET | Freq: Four times a day (QID) | ORAL | Status: DC

## 2022-01-17 MED ORDER — DIPHENHYDRAMINE HCL 25 MG PO CAPS: 25.0000 mg | ORAL_CAPSULE | Freq: Four times a day (QID) | ORAL | Status: DC | PRN

## 2022-01-17 MED ORDER — EPHEDRINE 5 MG/ML INJ
10.0000 mg | INTRAVENOUS | Status: DC | PRN
  Filled 2022-01-17: qty 2

## 2022-01-17 MED ORDER — LIDOCAINE HCL (PF) 1 % IJ SOLN
INTRAMUSCULAR | Status: AC
  Filled 2022-01-17: qty 30

## 2022-01-17 MED ORDER — MISOPROSTOL 50MCG HALF TABLET
ORAL_TABLET | ORAL | Status: AC
  Filled 2022-01-17: qty 1

## 2022-01-17 MED ORDER — LACTATED RINGERS IV SOLN: INTRAVENOUS | Status: DC

## 2022-01-17 MED ORDER — ACETAMINOPHEN 325 MG PO TABS
650.0000 mg | ORAL_TABLET | ORAL | Status: DC | PRN
  Administered 2022-01-18 (×4): 650 mg via ORAL
  Filled 2022-01-17 (×4): qty 2

## 2022-01-17 MED ORDER — LIDOCAINE-EPINEPHRINE (PF) 1.5 %-1:200000 IJ SOLN
INTRAMUSCULAR | Status: DC | PRN
  Administered 2022-01-17: 3 mL via EPIDURAL
  Administered 2022-01-17: 2 mL via EPIDURAL

## 2022-01-17 MED ORDER — BENZOCAINE-MENTHOL 20-0.5 % EX AERO
1.0000 "application " | INHALATION_SPRAY | CUTANEOUS | Status: DC | PRN
  Administered 2022-01-17: 1 via TOPICAL
  Filled 2022-01-17: qty 56

## 2022-01-17 MED ORDER — FENTANYL-BUPIVACAINE-NACL 0.5-0.125-0.9 MG/250ML-% EP SOLN
12.0000 mL/h | EPIDURAL | Status: DC | PRN
  Administered 2022-01-17: 12 mL/h via EPIDURAL

## 2022-01-17 MED ORDER — LIDOCAINE HCL (PF) 1 % IJ SOLN
INTRAMUSCULAR | Status: DC | PRN
  Administered 2022-01-17: 3 mL via SUBCUTANEOUS

## 2022-01-17 MED ORDER — LACTATED RINGERS IV SOLN: 500.0000 mL | Freq: Once | INTRAVENOUS | Status: DC

## 2022-01-17 MED ORDER — SIMETHICONE 80 MG PO CHEW: 80.0000 mg | CHEWABLE_TABLET | ORAL | Status: DC | PRN

## 2022-01-17 MED ORDER — IBUPROFEN 600 MG PO TABS
600.0000 mg | ORAL_TABLET | Freq: Four times a day (QID) | ORAL | Status: DC
  Administered 2022-01-18 – 2022-01-19 (×5): 600 mg via ORAL
  Filled 2022-01-17 (×5): qty 1

## 2022-01-17 MED ORDER — SODIUM CHLORIDE 0.9 % IV SOLN
INTRAVENOUS | Status: DC | PRN
  Administered 2022-01-17 (×2): 4 mL via EPIDURAL

## 2022-01-17 NOTE — Progress Notes (Signed)
Fetal monitoring removed from patient at this time for pt ambulation and utilization of the birthing ball. Will continue with intermittent fetal monitoring moving forward. Pt also saline locked at this time.

## 2022-01-17 NOTE — Progress Notes (Addendum)
LABOR NOTE   SUBJECTIVE:   Julie Rowe is a 31 y.o.GP@ at [redacted]w[redacted]d who is here for an induction of labor. She is in active labor after 3 doses of cytotec. She is feeling increased pressure and would like an epidural now.   Analgesia: IV pain meds and plans epidural  OBJECTIVE:  BP (!) 142/87    Pulse 74    Temp 97.7 F (36.5 C) (Oral)    Resp 17    Ht 5\' 6"  (1.676 m)    Wt 109.5 kg    LMP 04/10/2021 (Exact Date)    BMI 38.96 kg/m  No intake/output data recorded.  SVE:   Dilation: 7 Effacement (%): 100 Station: -2, -1 Exam by:: Cherylann Parr CNM CONTRACTIONS: regular, every 2 minutes FHR: Fetal heart tracing reviewed. Baseline: 120 Variability: moderate Accelerations: present Decelerations:early Category 1  Labs: Lab Results  Component Value Date   WBC 11.8 (H) 01/17/2022   HGB 10.9 (L) 01/17/2022   HCT 32.8 (L) 01/17/2022   MCV 88.4 01/17/2022   PLT 187 01/17/2022    ASSESSMENT: 1)  Induction of labor, progressing well     Coping: well. Would like epidural     Membranes: intact      Active Problems:   Labor and delivery, indication for care   PLAN: Epidural placement Continue present  management Anticipate NSVD Dr. Marcelline Mates updated  Lloyd Huger, CNM 01/17/2022 4:55 PM

## 2022-01-17 NOTE — H&P (Signed)
History and Physical   HPI  Julie Rowe is a 31 y.o. G3P1011 at [redacted]w[redacted]d Estimated Date of Delivery: 01/15/22 who is being admitted for induction of labor. She has a history of hypothyroid but her pregnancy has been otherwise uncomplicated. She has received one dose of misoprostol and is starting to feel more uncomfortable with contractions. Two elevated BPs  noted since admission.  OB History  OB History  Gravida Para Term Preterm AB Living  3 1 1  0 1 1  SAB IAB Ectopic Multiple Live Births  1 0 0 0 1    # Outcome Date GA Lbr Len/2nd Weight Sex Delivery Anes PTL Lv  3 Current           2 SAB 01/07/21 [redacted]w[redacted]d         1 Term 06/20/19 [redacted]w[redacted]d 11:17 / 03:13 3880 g M Vag-Spont EPI  LIV     Name: Buskirk,BOY Cherrelle     Apgar1: 7  Apgar5: 8    PROBLEM LIST  Pregnancy complications or risks: Patient Active Problem List   Diagnosis Date Noted   Labor and delivery, indication for care 01/08/2022   Obesity affecting pregnancy 07/07/2021   History of miscarriage 06/02/2021   Hashimoto's thyroiditis 12/31/2016   Dysmenorrhea 08/13/2016   Hypothyroidism 08/13/2016   Hypothyroidism due to Hashimoto's thyroiditis 10/21/2015    Prenatal labs and studies: ABO, Rh: --/--/A POS (02/25 0050) Antibody: NEG (02/25 0050) Rubella: 2.63 (08/11 1413) RPR: Non Reactive (12/02 0928)  HBsAg: Negative (08/11 1413)  HIV:   declined 03-09-2003-- (01/27 1445)   Past Medical History:  Diagnosis Date   Hypothyroid    Thyroid disease      Past Surgical History:  Procedure Laterality Date   TONSILLECTOMY     TONSILLECTOMY AND ADENOIDECTOMY       Medications    Current Discharge Medication List     CONTINUE these medications which have NOT CHANGED   Details  aspirin EC 81 MG tablet Take 1 tablet (81 mg total) by mouth daily. Swallow whole. Start at 16 wks Qty: 30 tablet, Refills: 11    levothyroxine (SYNTHROID) 88 MCG tablet TAKE 1 TABLET BY MOUTH DAILY BEFORE BREAKFAST. Qty: 90  tablet, Refills: 1    Prenatal Vit-Fe Fumarate-FA (PRENATAL MULTIVITAMIN) TABS tablet Take 1 tablet by mouth daily at 12 noon.         Allergies  Patient has no known allergies.  Review of Systems  A comprehensive review of systems was negative.  Physical Exam  BP 140/87 (BP Location: Left Arm)    Pulse 73    Temp 97.9 F (36.6 C) (Oral)    Resp 17    Ht 5\' 6"  (1.676 m)    Wt 109.5 kg    LMP 04/10/2021 (Exact Date)    BMI 38.96 kg/m   Lungs:  CTAB Cardio: RRR without M/R/G Abd: Soft, gravid, NT Presentation: cephalic  CERVIX: Dilation: 1 Effacement (%): 40 Station: -3 Presentation: Vertex Exam by:: 04/12/2021 Guptill RN  See Prenatal records for more detailed PE.     FHR:  Baseline: 120 Variability: Moderate Accelerations: present, 15x15 Decelerations: None Toco: q3-5 Category 1  Test Results  Results for orders placed or performed during the hospital encounter of 01/17/22 (from the past 24 hour(s))  CBC     Status: Abnormal   Collection Time: 01/17/22 12:50 AM  Result Value Ref Range   WBC 11.8 (H) 4.0 - 10.5 K/uL   RBC 3.71 (L) 3.87 - 5.11  MIL/uL   Hemoglobin 10.9 (L) 12.0 - 15.0 g/dL   HCT 33.0 (L) 07.6 - 22.6 %   MCV 88.4 80.0 - 100.0 fL   MCH 29.4 26.0 - 34.0 pg   MCHC 33.2 30.0 - 36.0 g/dL   RDW 33.3 54.5 - 62.5 %   Platelets 187 150 - 400 K/uL   nRBC 0.0 0.0 - 0.2 %  Type and screen     Status: None   Collection Time: 01/17/22 12:50 AM  Result Value Ref Range   ABO/RH(D) A POS    Antibody Screen NEG    Sample Expiration      01/20/2022,2359 Performed at [facility], [address] [place], [place] [address]    Group B Strep negative  Assessment   G3P1011 at [redacted]w[redacted]d Estimated Date of Delivery: 01/15/22  Reassuring maternal/fetal status.  Patient Active Problem List   Diagnosis Date Noted   Labor and delivery, indication for care 01/08/2022   Obesity affecting pregnancy 07/07/2021   History of miscarriage 06/02/2021    Hashimoto's thyroiditis 12/31/2016   Dysmenorrhea 08/13/2016   Hypothyroidism 08/13/2016   Hypothyroidism due to Hashimoto's thyroiditis 10/21/2015    Plan  1. Admit to L&D for IOL 2. EFM: Category 1. May have intermittent monitoring after 1 hour of continuous monitoring with misoprostol. Continuous EFM with Pitocin.  3. Pharmacologic pain relief if desired.   4. Admission labs. Pre-eclampsia labs. 5. Anticipate NSVD 6. MD aware of admission  Guadlupe Spanish, [facility] 01/17/2022 6:05 AM

## 2022-01-17 NOTE — Anesthesia Procedure Notes (Signed)
Epidural Patient location during procedure: OB Start time: 01/17/2022 4:55 PM End time: 01/17/2022 5:05 PM  Staffing Anesthesiologist: Lenard Simmer, MD Performed: anesthesiologist   Preanesthetic Checklist Completed: patient identified, IV checked, site marked, risks and benefits discussed, surgical consent, monitors and equipment checked, pre-op evaluation and timeout performed  Epidural Patient position: sitting Prep: ChloraPrep Patient monitoring: heart rate, continuous pulse ox and blood pressure Approach: midline Location: L3-L4 Injection technique: LOR saline  Needle:  Needle type: Tuohy  Needle gauge: 17 G Needle length: 9 cm and 9 Needle insertion depth: 6.5 cm Catheter type: closed end flexible Catheter size: 19 Gauge Catheter at skin depth: 11.5 cm Test dose: negative and 1.5% lidocaine with Epi 1:200 K  Assessment Sensory level: T10 Events: blood not aspirated (1st), injection not painful, no injection resistance, no paresthesia and negative IV test  Additional Notes 1st attempt Pt. Evaluated and documentation done after procedure finished. Patient identified. Risks/Benefits/Options discussed with patient including but not limited to bleeding, infection, nerve damage, paralysis, failed block, incomplete pain control, headache, blood pressure changes, nausea, vomiting, reactions to medication both or allergic, itching and postpartum back pain. Confirmed with bedside nurse the patient's most recent platelet count. Confirmed with patient that they are not currently taking any anticoagulation, have any bleeding history or any family history of bleeding disorders. Patient expressed understanding and wished to proceed. All questions were answered. Sterile technique was used throughout the entire procedure. Please see nursing notes for vital signs. Test dose was given through epidural catheter and negative prior to continuing to dose epidural or start infusion. Warning signs  of high block given to the patient including shortness of breath, tingling/numbness in hands, complete motor block, or any concerning symptoms with instructions to call for help. Patient was given instructions on fall risk and not to get out of bed. All questions and concerns addressed with instructions to call with any issues or inadequate analgesia.    Patient tolerated the insertion well without immediate complications.Reason for block:procedure for pain

## 2022-01-17 NOTE — Progress Notes (Signed)
LABOR NOTE   SUBJECTIVE:   Julie Rowe is a 31 y.o.GP@ at [redacted]w[redacted]d here for induction of labor. She has received 2 doses of cytotec and is feeling occasional uncomfortable contractions. She is coping well. Analgesia: Labor support without medications and possibly epidural later  OBJECTIVE:  BP 120/68    Pulse 85    Temp 97.9 F (36.6 C) (Oral)    Resp 17    Ht 5\' 6"  (1.676 m)    Wt 109.5 kg    LMP 04/10/2021 (Exact Date)    BMI 38.96 kg/m  No intake/output data recorded.  SVE:   Dilation: 1.5 Effacement (%): 40 Station: -3 Exam by:: [email or web address] CNM CONTRACTIONS: regular, every 1-3 minutes FHR: Fetal heart tracing reviewed. Baseline: 125 Variability: moderate Accelerations: Present, 15x15 Decelerations:none Category 1  Labs: Lab Results  Component Value Date   WBC 11.8 (H) 01/17/2022   HGB 10.9 (L) 01/17/2022   HCT 32.8 (L) 01/17/2022   MCV 88.4 01/17/2022   PLT 187 01/17/2022    ASSESSMENT: 1)  Cervical ripening, making some cervical change     Coping: well     Membranes: intact       Active Problems:   Labor and delivery, indication for care   PLAN: Will administer next dose of cytotec when contractions have spaced out Pitocin or AROM when appropriate  01/19/2022, CNM 01/17/2022 10:53 AM

## 2022-01-17 NOTE — Progress Notes (Signed)
Fetal monitoring removed from patient at this time for pt ambulation and utilization of the birthing ball. Will continue with intermittent fetal monitoring.

## 2022-01-17 NOTE — Progress Notes (Addendum)
LABOR NOTE   SUBJECTIVE:   Julie Rowe is a 31 y.o.GP@ at [redacted]w[redacted]d here for induction of labor. She has had 3 doses of cytotec and is having regular, painful contractions with cervical change now.   Analgesia: Labor support without medications and considering epidural later  OBJECTIVE:  BP (!) 142/87    Pulse 74    Temp 97.8 F (36.6 C) (Oral)    Resp 17    Ht 5\' 6"  (1.676 m)    Wt 109.5 kg    LMP 04/10/2021 (Exact Date)    BMI 38.96 kg/m  No intake/output data recorded.  SVE:   Dilation: 4 Effacement (%): 70 Station: -3 Exam by:: [email or web address], CNM CONTRACTIONS: regular, every 2 minutes FHR: Fetal heart tracing reviewed. Baseline: 125 Variability: moderate Accelerations: present, 15x15 Decelerations:none Category 1  Labs: Lab Results  Component Value Date   WBC 11.8 (H) 01/17/2022   HGB 10.9 (L) 01/17/2022   HCT 32.8 (L) 01/17/2022   MCV 88.4 01/17/2022   PLT 187 01/17/2022    ASSESSMENT: 1)  Induction of labor, progressing well with Pitocin     Coping:Well. Good support.     Membranes: intact  Active Problems:   Labor and delivery, indication for care   PLAN: Continue present management Consider AROM or Pitocin if augmentation needed Dr. 01/19/2022 updated on progress  Valentino Saxon, CNM 01/17/2022 2:47 PM

## 2022-01-17 NOTE — Anesthesia Preprocedure Evaluation (Signed)
Anesthesia Evaluation  Patient identified by MRN, date of birth, ID band Patient awake    Reviewed: Allergy & Precautions, H&P , NPO status , Patient's Chart, lab work & pertinent test results, reviewed documented beta blocker date and time   History of Anesthesia Complications Negative for: history of anesthetic complications  Airway Mallampati: I  TM Distance: >3 FB Neck ROM: full    Dental  (+) Dental Advidsory Given, Teeth Intact   Pulmonary neg pulmonary ROS,    Pulmonary exam normal        Cardiovascular Exercise Tolerance: Good negative cardio ROS Normal cardiovascular exam     Neuro/Psych negative neurological ROS  negative psych ROS   GI/Hepatic Neg liver ROS, GERD  ,  Endo/Other  neg diabetesHypothyroidism   Renal/GU negative Renal ROS  negative genitourinary   Musculoskeletal   Abdominal   Peds  Hematology negative hematology ROS (+)   Anesthesia Other Findings Past Medical History: No date: Hypothyroid No date: Thyroid disease   Reproductive/Obstetrics (+) Pregnancy                             Anesthesia Physical  Anesthesia Plan  ASA: 2  Anesthesia Plan: Epidural   Post-op Pain Management:    Induction:   PONV Risk Score and Plan:   Airway Management Planned:   Additional Equipment:   Intra-op Plan:   Post-operative Plan:   Informed Consent: I have reviewed the patients History and Physical, chart, labs and discussed the procedure including the risks, benefits and alternatives for the proposed anesthesia with the patient or authorized representative who has indicated his/her understanding and acceptance.     Dental Advisory Given  Plan Discussed with: Anesthesiologist, CRNA and Surgeon  Anesthesia Plan Comments:         Anesthesia Quick Evaluation

## 2022-01-18 ENCOUNTER — Encounter: Payer: Self-pay | Admitting: Obstetrics

## 2022-01-18 NOTE — Progress Notes (Signed)
Post Partum Day 0 Subjective: Julie Rowe is s/p NSVD at 1916 on 01/17/22. She is ambulating well and has showered. She is tolerating POs and voiding without difficulty. Breastfeeding is going well. Vital signs are stable. She would like to go home tomorrow morning.  Objective: Blood pressure 125/80, pulse 75, temperature 97.8 F (36.6 C), temperature source Oral, resp. rate 20, height 5\' 6"  (1.676 m), weight 109.5 kg, last menstrual period 04/10/2021, SpO2 96 %, unknown if currently breastfeeding.  Physical Exam:  General: alert, cooperative, and appears stated age 31: appropriate Uterine Fundus: firm Perineum: healing well DVT Evaluation: No evidence of DVT seen on physical exam.  Recent Labs    01/17/22 0050  HGB 10.9*  HCT 32.8*    Assessment/Plan: Plan for discharge tomorrow, Breastfeeding, and Contraception : Natural family planning Discharge instructions reviewed. PP video visit in 2 weeks and office visit in 6 weeks.  LOS: 1 day   01/19/22 01/18/2022, 12:20 PM

## 2022-01-18 NOTE — Plan of Care (Signed)
  Problem: Education: Goal: Knowledge of General Education information will improve Description: Including pain rating scale, medication(s)/side effects and non-pharmacologic comfort measures Outcome: Progressing   Problem: Clinical Measurements: Goal: Ability to maintain clinical measurements within normal limits will improve Outcome: Progressing   Problem: Activity: Goal: Risk for activity intolerance will decrease Outcome: Progressing   Problem: Nutrition: Goal: Adequate nutrition will be maintained Outcome: Progressing   Problem: Elimination: Goal: Will not experience complications related to bowel motility Outcome: Progressing   Problem: Pain Managment: Goal: General experience of comfort will improve Outcome: Progressing   Problem: Safety: Goal: Ability to remain free from injury will improve Outcome: Progressing   

## 2022-01-19 NOTE — Progress Notes (Signed)
Patient discharged with infant. Discharge instructions, prescriptions, and follow up appointments given to and reviewed with patient. Patient verbalized understanding. Will be escorted out by axillary.  °

## 2022-01-19 NOTE — Anesthesia Postprocedure Evaluation (Signed)
Anesthesia Post Note  Patient: Julie Rowe  Procedure(s) Performed: AN AD HOC LABOR EPIDURAL  Patient location during evaluation: Mother Baby Anesthesia Type: Epidural Level of consciousness: awake and alert Pain management: pain level controlled Vital Signs Assessment: post-procedure vital signs reviewed and stable Respiratory status: spontaneous breathing, nonlabored ventilation and respiratory function stable Cardiovascular status: stable Postop Assessment: no headache, no backache, epidural receding and able to ambulate Anesthetic complications: no   No notable events documented.   Last Vitals:  Vitals:   01/19/22 0232 01/19/22 0717  BP: 113/72 139/84  Pulse: 73 72  Resp: 18 18  Temp: 36.7 C 36.6 C  SpO2: 98% 100%    Last Pain:  Vitals:   01/19/22 0751  TempSrc:   PainSc: 1                  Rosanne Gutting

## 2022-01-19 NOTE — Lactation Note (Signed)
This note was copied from a baby's chart. Lactation Consultation Note  Patient Name: Julie Rowe [id number]'U Date: 01/19/2022 Reason for consult: Initial assessment;Term Age:31 hours  Initial and discharge visit. Mom is P2 with 14 months breastfeeding experience with first baby. Mom reports baby is doing well, no over all concerns.  Maternal Data Has patient been taught Hand Expression?: Yes Does the patient have breastfeeding experience prior to this delivery?: Yes How long did the patient breastfeed?: 14 months  Feeding Mother's Current Feeding Choice: Breast Milk  LATCH Score                    Lactation Tools Discussed/Used    Interventions Interventions: Breast feeding basics reviewed;Breast massage;Hand express;Pre-pump if needed;Position options;Ice;Education  Reviewed normal newborn feeding patterns and demands, cluster feeding, growth spurts, position/latch, wide mouth, alignment, output expectations, milk supply and demand, and ensuring transfer.  Discharge Discharge Education: Engorgement and breast care;Warning signs for feeding baby;Outpatient recommendation Pump: Personal  Guidance given for anticipated breast changes and management of changes, need for softening of tissue prior to latching, possible hand expression/hand pump use if still full, ice for swelling, and tylenol.  Outpatient lactation service information provided; encouraged to call as needed.  Consult Status Consult Status: Complete    Danford Bad 01/19/2022, 11:00 AM

## 2022-01-19 NOTE — Discharge Summary (Signed)
Postpartum Discharge Summary     Patient Name: Julie Rowe DOB: [birth year] MRN: [id number]  Date of admission: 01/17/2022 Delivery date:01/17/2022  Delivering provider: Lurlean Horns  Date of discharge: 01/19/2022  Admitting diagnosis: Labor and delivery, indication for care [O75.9] Intrauterine pregnancy: [redacted]w[redacted]d    Secondary diagnosis:  Active Problems:   Hypothyroidism due to Hashimoto's thyroiditis   Obesity affecting pregnancy   Labor and delivery, indication for care  Additional problems: None    Discharge diagnosis: Term Pregnancy Delivered                                              Post partum procedures: None Augmentation: Cytotec Complications: None  Hospital course: Induction of Labor With Vaginal Delivery   31y.o. yo [id number] admitted to the hospital 01/17/2022 for induction of labor.  Indication for induction: Postdates.  Patient had an uncomplicated labor course as follows: Membrane Rupture Time/Date: 5:50 PM ,01/17/2022   Delivery Method:Vaginal, Spontaneous  Episiotomy: None  Lacerations:  1st degree  Details of delivery can be found in separate delivery note.  Patient had a routine postpartum course. Patient is discharged home 01/19/22.  Newborn Data: Birth date:01/17/2022  Birth time:7:16 PM  Gender:Female  Living status:Living  Apgars:2 ,8  Weight:4030 g   Magnesium Sulfate received: No BMZ received: No Rhophylac:No MMR:No T-DaP:Given prenatally Flu: No Transfusion:No  Physical exam  Vitals:   01/18/22 0815 01/18/22 1220 01/19/22 0232 01/19/22 0717  BP: 125/80 129/79 113/72 139/84  Pulse: 75 83 73 72  Resp: '20 18 18 18  ' Temp: 97.8 F (36.6 C) 98.1 F (36.7 C) 98.1 F (36.7 C) 97.9 F (36.6 C)  TempSrc: Oral Oral    SpO2: 96%  98% 100%  Weight:      Height:       General: alert, cooperative, and no distress Lochia: appropriate Uterine Fundus: firm Incision: N/A DVT Evaluation: No evidence of DVT seen on physical  exam. Negative Homan's sign. No cords or calf tenderness. No significant calf/ankle edema.   Labs: Lab Results  Component Value Date   WBC 11.8 (H) 01/17/2022   HGB 10.9 (L) 01/17/2022   HCT 32.8 (L) 01/17/2022   MCV 88.4 01/17/2022   PLT 187 01/17/2022    CMP Latest Ref Rng & Units 01/17/2022  Glucose 70 - 99 mg/dL 96  BUN 6 - 20 mg/dL 8  Creatinine 0.44 - 1.00 mg/dL 0.63  Sodium 135 - 145 mmol/L 135  Potassium 3.5 - 5.1 mmol/L 3.8  Chloride 98 - 111 mmol/L 105  CO2 22 - 32 mmol/L 20(L)  Calcium 8.9 - 10.3 mg/dL 9.5  Total Protein 6.5 - 8.1 g/dL 6.6  Total Bilirubin 0.3 - 1.2 mg/dL 0.5  Alkaline Phos 38 - 126 U/L 129(H)  AST 15 - 41 U/L 16  ALT 0 - 44 U/L 11   Edinburgh Score: Edinburgh Postnatal Depression Scale Screening Tool 01/18/2022  I have been able to laugh and see the funny side of things. 0  I have looked forward with enjoyment to things. 0  I have blamed myself unnecessarily when things went wrong. 1  I have been anxious or worried for no good reason. 2  I have felt scared or panicky for no good reason. 2  Things have been getting on top of me. 1  I have  been so unhappy that I have had difficulty sleeping. 0  I have felt sad or miserable. 1  I have been so unhappy that I have been crying. 1  The thought of harming myself has occurred to me. 0  Edinburgh Postnatal Depression Scale Total 8      After visit meds:  Allergies as of 01/19/2022   No Known Allergies      Medication List     TAKE these medications    levothyroxine 88 MCG tablet Commonly known as: SYNTHROID TAKE 1 TABLET BY MOUTH DAILY BEFORE BREAKFAST.   prenatal multivitamin Tabs tablet Take 1 tablet by mouth daily at 12 noon.         Discharge home in stable condition Infant Feeding: Breast Infant Disposition:home with mother Discharge instruction: per After Visit Summary and Postpartum booklet. Activity: Advance as tolerated. Pelvic rest for 6 weeks.  Diet: routine  diet Anticipated Birth Control:  Natural Family Planning Postpartum Appointment:6 weeks Additional Postpartum F/U: Postpartum Depression checkup in 2 weeks Future Appointments: Future Appointments  Date Time Provider [place]  02/04/2022  3:30 PM Lurlean Horns, CNM [facility] None  03/04/2022 10:30 AM Lurlean Horns, CNM [facility] None   Follow up Visit:      01/19/2022 Rubie Maid, MD

## 2022-01-21 ENCOUNTER — Other Ambulatory Visit: Payer: Self-pay | Admitting: Certified Nurse Midwife

## 2022-02-04 ENCOUNTER — Telehealth (INDEPENDENT_AMBULATORY_CARE_PROVIDER_SITE_OTHER): Payer: 59 | Admitting: Obstetrics

## 2022-02-04 NOTE — Progress Notes (Signed)
Virtual Visit via Video Note ? ?I connected with Julie Rowe on 02/04/22 at  3:00 PM EDT by a video enabled telemedicine application and verified that I am speaking with the correct person using two identifiers. ? ?Location: ?Patient: Home ?Provider: Office ?  ?I discussed the limitations of evaluation and management by telemedicine and the availability of in person appointments. The patient expressed understanding and agreed to proceed. ? ?History of Present Illness: ?Julie Rowe is a 31 y.o. [id number] who is s/p NSVD on 01/17/22. She birth a viable 8 lb., 14 oz. female infant "Julie Rowe" over an intact perineum. She had epidural anesthesia. She is breastfeeding. ?  ?Observations/Objective: ?Breastfeeding going well. No pain or lumps in breast. ?Bleeding is dark pink/red and getting lighter. ?Denies pain and cramping. ?Denies concerns about depression and anxiety. States she is tired but her mood is good. ?Normal voiding and bowel movements. ?She plans NFP for contraception. ?Baby has been doing well. He has some swelling in his sinuses that is being followed by pediatrician but is otherwise well. ? ?Assessment and Plan: ?Normal postpartum course. ?Call with questions or concerns. ?Keep scheduled 6-week PP visit. ? ?Follow Up Instructions: ? ?  ?I discussed the assessment and treatment plan with the patient. The patient was provided an opportunity to ask questions and all were answered. The patient agreed with the plan and demonstrated an understanding of the instructions. ?  ?The patient was advised to call back or seek an in-person evaluation if she has any questions or concerns.  ? ?I provided 6 minutes of non-face-to-face time during this encounter. ? ? ?Glenetta Borg, CNM ? ? ?

## 2022-03-04 ENCOUNTER — Encounter: Payer: 59 | Admitting: Obstetrics

## 2022-03-05 ENCOUNTER — Encounter: Payer: 59 | Admitting: Obstetrics

## 2022-03-06 ENCOUNTER — Ambulatory Visit (INDEPENDENT_AMBULATORY_CARE_PROVIDER_SITE_OTHER): Payer: 59 | Admitting: Obstetrics

## 2022-03-06 ENCOUNTER — Encounter: Payer: Self-pay | Admitting: Obstetrics

## 2022-03-06 VITALS — BP 117/83 | HR 83 | Ht 66.0 in | Wt 216.3 lb

## 2022-03-06 DIAGNOSIS — E039 Hypothyroidism, unspecified: Secondary | ICD-10-CM

## 2022-03-06 NOTE — Progress Notes (Signed)
SUBJECTIVE ? ?Julie Rowe is a 31 y.o. [id number] who gave birth to a female infant on 01/17/22 at [redacted]w[redacted]d via NSVD over an intact perineum with epidural anesthesia.  She is breast feeding. She is here today for a PP follow-up visit.  ? ? ?ROS ? ?Pertinent items are noted in HPI. ? ?Mood: feels well. EPDS 4.  ?  ? ?Bowel function: normal ?Bladder function: normal ?Vaginal bleeding: stopped a week ago ?Pain: denies ? ?Denies difficulty breathing, chest pain, lower extremity pain or swelling, excessive vaginal bleeding, vaginal pain.  ? ?Infant: Weighs 13 lbs! Doing well. Excellent breastfeeder. ? ?OBJECTIVE ?Last Weight  Most recent update: 03/06/2022 10:23 AM  ? ? Weight  ?98.1 kg (216 lb 4.8 oz)  ?      ? ?  ? ?Body mass index is 34.91 kg/m?. ? ?BP 117/83   Pulse 83   Ht 5\' 6"  (1.676 m)   Wt 216 lb 4.8 oz (98.1 kg)   LMP  (LMP Unknown)   Breastfeeding Yes   BMI 34.91 kg/m?  ?Patient has no known allergies. ? ?Past Medical/Surgical History ?Past Medical History:  ?Diagnosis Date  ? Hypothyroid   ? Thyroid disease   ? ?Past Surgical History:  ?Procedure Laterality Date  ? TONSILLECTOMY    ? TONSILLECTOMY AND ADENOIDECTOMY    ? ? ?Last Pap: 3/1/22was normal ? ?BP 117/83   Pulse 83   Ht 5\' 6"  (1.676 m)   Wt 216 lb 4.8 oz (98.1 kg)   LMP  (LMP Unknown)   Breastfeeding Yes   BMI 34.91 kg/m?  ?General appearance: alert, cooperative, and appears stated age ?Head: Normocephalic, without obvious abnormality, atraumatic ?Eyes: negative findings: lids and lashes normal and conjunctivae and sclerae normal ?Neck: no adenopathy, supple, symmetrical, trachea midline, and thyroid not enlarged, symmetric, no tenderness/mass/nodules ?Lungs: clear to auscultation bilaterally ?Breasts: normal appearance, no masses or tenderness, No nipple retraction or dimpling, No axillary or supraclavicular adenopathy, Normal to palpation without dominant masses ?Heart: regular rate and rhythm, S1, S2 normal, no murmur, click, rub or  gallop ?Abdomen: soft, non-tender; bowel sounds normal; no masses,  no organomegaly ?Pelvic: deferred ?Extremities: extremities normal, atraumatic, no cyanosis or edema ?Pulses: 2+ and symmetric ?Skin: Skin color, texture, turgor normal. No rashes or lesions ?Lymph nodes: Cervical, supraclavicular, and axillary nodes normal. ? ?ASSESSMENT ?1) 6 weeks postpartum ?2) Breastfeeding  ? ?PLAN ?The pregnancy intention screening data noted above was reviewed. Potential methods of contraception were discussed. The patient elected to proceed with lactational amenorrhea/NFP and condoms  ? ?Discussed PP mood changes and coping strategies. TSH drawn today. Follow up in 6 months for annual exam or PRN. ? ? ?03/23/21, CNM  ?

## 2022-03-07 LAB — TSH: TSH: 0.275 u[IU]/mL — ABNORMAL LOW (ref 0.450–4.500)

## 2022-03-08 ENCOUNTER — Encounter: Payer: Self-pay | Admitting: Obstetrics

## 2022-03-10 ENCOUNTER — Other Ambulatory Visit: Payer: Self-pay | Admitting: Obstetrics

## 2022-03-10 DIAGNOSIS — E039 Hypothyroidism, unspecified: Secondary | ICD-10-CM

## 2022-03-19 ENCOUNTER — Other Ambulatory Visit: Payer: 59

## 2022-03-20 ENCOUNTER — Other Ambulatory Visit: Payer: 59

## 2022-03-20 DIAGNOSIS — E039 Hypothyroidism, unspecified: Secondary | ICD-10-CM | POA: Diagnosis not present

## 2022-03-21 LAB — THYROID PANEL
Free Thyroxine Index: 2.7 (ref 1.2–4.9)
T3 Uptake Ratio: 30 % (ref 24–39)
T4, Total: 9 ug/dL (ref 4.5–12.0)

## 2022-03-21 LAB — THYROID ANTIBODIES
Thyroglobulin Antibody: 204.6 IU/mL — ABNORMAL HIGH (ref 0.0–0.9)
Thyroperoxidase Ab SerPl-aCnc: 327 IU/mL — ABNORMAL HIGH (ref 0–34)

## 2022-07-23 ENCOUNTER — Other Ambulatory Visit (HOSPITAL_COMMUNITY)
Admission: RE | Admit: 2022-07-23 | Discharge: 2022-07-23 | Disposition: A | Discharge status: Home / Self Care | Payer: 59 | Source: Ambulatory Visit | Attending: Obstetrics | Admitting: Obstetrics

## 2022-07-23 ENCOUNTER — Ambulatory Visit (INDEPENDENT_AMBULATORY_CARE_PROVIDER_SITE_OTHER): Payer: 59 | Admitting: Obstetrics

## 2022-07-23 ENCOUNTER — Encounter: Payer: Self-pay | Admitting: Obstetrics

## 2022-07-23 VITALS — BP 116/81 | HR 66 | Ht 66.0 in | Wt 216.0 lb

## 2022-07-23 DIAGNOSIS — N926 Irregular menstruation, unspecified: Secondary | ICD-10-CM

## 2022-07-23 DIAGNOSIS — N939 Abnormal uterine and vaginal bleeding, unspecified: Secondary | ICD-10-CM | POA: Diagnosis not present

## 2022-07-23 NOTE — Progress Notes (Signed)
GYN ENCOUNTER  Encounter for Abnormal Menstrual Bleeding  Subjective  HPI: Julie Rowe is a 31 y.o. [id number] who presents today for abnormal menstrual bleeding. She is exclusively breastfeeding her 33-month-old son around the clock. Her menses resumed 05/27/22 and lasted a week. Her next period last from 07/01/22-07/13/22. She then had a few days of spotting, and another menstrual period that started 07/21/22. She is not using any hormonal contraception. She denies s/s of infection. Her postpartum course was normal.  Past Medical History:  Diagnosis Date   Hypothyroid    Thyroid disease    Past Surgical History:  Procedure Laterality Date   TONSILLECTOMY     TONSILLECTOMY AND ADENOIDECTOMY     OB History     Gravida  3   Para  2   Term  2   Preterm  0   AB  1   Living  2      SAB  1   IAB  0   Ectopic  0   Multiple  0   Live Births  2          No Known Allergies  Negative except as noted in HPI History obtained from the patient  Objective  BP 116/81   Pulse 66   Ht 5\' 6"  (1.676 m)   Wt 216 lb (98 kg)   LMP 07/01/2022 (Exact Date)   Breastfeeding Yes   BMI 34.86 kg/m   General appearance: alert, cooperative Pelvic: External genitalia normal, Vagina normal without discharge; light Preziosi blood in vaginal vault, no bladder tenderness, cervix normal in appearance, no CMT, uterus normal size, shape, and consistency. Swabs collected.  Assessment Abnormal menstrual bleeding  Plan Reviewed irregularity in menstruation when breastfeeding. Will check thyroid profile. Swabs collected.  F/u based on results. Instructed to return to office if menstrual bleeding continues to be prolonged or heavy.   08/31/2022, CNM

## 2022-07-24 LAB — THYROID PANEL WITH TSH
Free Thyroxine Index: 1.5 (ref 1.2–4.9)
T3 Uptake Ratio: 22 % — ABNORMAL LOW (ref 24–39)
T4, Total: 7 ug/dL (ref 4.5–12.0)
TSH: 4.7 u[IU]/mL — ABNORMAL HIGH (ref 0.450–4.500)

## 2022-07-29 LAB — CERVICOVAGINAL ANCILLARY ONLY
Bacterial Vaginitis (gardnerella): NEGATIVE
Candida Glabrata: NEGATIVE
Candida Vaginitis: NEGATIVE
Chlamydia: NEGATIVE
Comment: NEGATIVE
Comment: NEGATIVE
Comment: NEGATIVE
Comment: NEGATIVE
Comment: NEGATIVE
Comment: NORMAL
Neisseria Gonorrhea: NEGATIVE
Trichomonas: NEGATIVE

## 2022-08-02 ENCOUNTER — Other Ambulatory Visit: Payer: Self-pay | Admitting: Certified Nurse Midwife

## 2022-08-11 ENCOUNTER — Encounter: Payer: Self-pay | Admitting: Obstetrics

## 2022-08-11 ENCOUNTER — Other Ambulatory Visit: Payer: Self-pay | Admitting: Obstetrics

## 2022-08-11 DIAGNOSIS — E039 Hypothyroidism, unspecified: Secondary | ICD-10-CM

## 2022-08-11 MED ORDER — LEVOTHYROXINE SODIUM 100 MCG PO TABS: 100.0000 ug | ORAL_TABLET | Freq: Every day | ORAL | 1 refills | Status: DC

## 2022-08-26 ENCOUNTER — Other Ambulatory Visit: Payer: Self-pay | Admitting: Obstetrics

## 2022-08-26 DIAGNOSIS — E038 Other specified hypothyroidism: Secondary | ICD-10-CM

## 2022-09-04 ENCOUNTER — Encounter: Payer: Self-pay | Admitting: Obstetrics

## 2022-09-07 ENCOUNTER — Encounter: Payer: Self-pay | Admitting: Certified Nurse Midwife

## 2022-09-08 ENCOUNTER — Other Ambulatory Visit: Payer: Self-pay | Admitting: Obstetrics

## 2022-09-21 ENCOUNTER — Other Ambulatory Visit: Payer: 59

## 2022-09-21 DIAGNOSIS — E039 Hypothyroidism, unspecified: Secondary | ICD-10-CM

## 2022-09-22 LAB — THYROID PANEL WITH TSH
Free Thyroxine Index: 1.7 (ref 1.2–4.9)
T3 Uptake Ratio: 27 % (ref 24–39)
T4, Total: 6.4 ug/dL (ref 4.5–12.0)
TSH: 4.23 u[IU]/mL (ref 0.450–4.500)

## 2022-09-25 ENCOUNTER — Encounter: Payer: Self-pay | Admitting: Obstetrics

## 2022-10-18 ENCOUNTER — Other Ambulatory Visit: Payer: Self-pay | Admitting: Obstetrics

## 2022-12-30 ENCOUNTER — Other Ambulatory Visit: Payer: Self-pay | Admitting: Obstetrics

## 2023-04-23 DIAGNOSIS — Z131 Encounter for screening for diabetes mellitus: Secondary | ICD-10-CM | POA: Diagnosis not present

## 2023-04-23 DIAGNOSIS — Z13220 Encounter for screening for lipoid disorders: Secondary | ICD-10-CM | POA: Diagnosis not present

## 2023-04-23 DIAGNOSIS — Z0000 Encounter for general adult medical examination without abnormal findings: Secondary | ICD-10-CM | POA: Diagnosis not present

## 2023-04-23 DIAGNOSIS — E039 Hypothyroidism, unspecified: Secondary | ICD-10-CM | POA: Diagnosis not present

## 2023-04-23 DIAGNOSIS — Z130 Encounter for screening for diseases of the blood and blood-forming organs and certain disorders involving the immune mechanism: Secondary | ICD-10-CM | POA: Diagnosis not present

## 2023-04-23 DIAGNOSIS — Z1331 Encounter for screening for depression: Secondary | ICD-10-CM | POA: Diagnosis not present

## 2023-06-24 DIAGNOSIS — D649 Anemia, unspecified: Secondary | ICD-10-CM | POA: Diagnosis not present

## 2023-06-24 DIAGNOSIS — E039 Hypothyroidism, unspecified: Secondary | ICD-10-CM | POA: Diagnosis not present

## 2023-07-01 DIAGNOSIS — N920 Excessive and frequent menstruation with regular cycle: Secondary | ICD-10-CM | POA: Diagnosis not present

## 2023-07-01 DIAGNOSIS — D500 Iron deficiency anemia secondary to blood loss (chronic): Secondary | ICD-10-CM | POA: Diagnosis not present

## 2023-07-01 DIAGNOSIS — E039 Hypothyroidism, unspecified: Secondary | ICD-10-CM | POA: Diagnosis not present

## 2024-01-13 ENCOUNTER — Ambulatory Visit: Payer: 59 | Admitting: Obstetrics

## 2024-01-28 ENCOUNTER — Ambulatory Visit: Payer: 59 | Admitting: Obstetrics

## 2024-01-28 DIAGNOSIS — Z0000 Encounter for general adult medical examination without abnormal findings: Secondary | ICD-10-CM

## 2024-02-10 ENCOUNTER — Encounter: Payer: Self-pay | Admitting: Obstetrics

## 2024-02-10 ENCOUNTER — Other Ambulatory Visit (HOSPITAL_COMMUNITY)
Admission: RE | Admit: 2024-02-10 | Discharge: 2024-02-10 | Disposition: A | Discharge status: Home / Self Care | Source: Ambulatory Visit | Attending: Obstetrics | Admitting: Obstetrics

## 2024-02-10 ENCOUNTER — Ambulatory Visit (INDEPENDENT_AMBULATORY_CARE_PROVIDER_SITE_OTHER): Admitting: Obstetrics

## 2024-02-10 VITALS — BP 136/89 | HR 85 | Ht 66.0 in | Wt 238.0 lb

## 2024-02-10 DIAGNOSIS — Z124 Encounter for screening for malignant neoplasm of cervix: Secondary | ICD-10-CM | POA: Diagnosis not present

## 2024-02-10 DIAGNOSIS — Z0000 Encounter for general adult medical examination without abnormal findings: Secondary | ICD-10-CM

## 2024-02-10 DIAGNOSIS — Z01419 Encounter for gynecological examination (general) (routine) without abnormal findings: Secondary | ICD-10-CM | POA: Diagnosis not present

## 2024-02-10 NOTE — Progress Notes (Signed)
 ANNUAL GYNECOLOGICAL EXAM  SUBJECTIVE  HPI  Julie Rowe is a 33 y.o.-year-old [id number] who presents for an annual gynecological exam today.  She denies pelvic pain, dyspareunia, abnormal vaginal bleeding or discharge, and UTI symptoms. Her thyroid levels have been well controlled. Her husband recently converted to Catholicism and does not want to use condoms, and she is interested in discussing birth control methods and NFP. She would like to try for another baby in about a year. She has concerns about inability to lose despite dietary changes and regular exercise.  Medical/Surgical History Past Medical History:  Diagnosis Date   Hypothyroid    Thyroid disease    Past Surgical History:  Procedure Laterality Date   TONSILLECTOMY     TONSILLECTOMY AND ADENOIDECTOMY      Social History Lives with husband and young sons. 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 Regular monthly periods Last Pap: 01/21/2021. NILM. Contraception: none  Prevention Mammogram: at 40 Colonoscopy: at 48   Current Medications Outpatient Medications Prior to Visit  Medication Sig   levothyroxine (SYNTHROID) 100 MCG tablet TAKE 1 TABLET BY MOUTH EVERY DAY BEFORE BREAKFAST   [DISCONTINUED] Prenatal Vit-Fe Fumarate-FA (PRENATAL MULTIVITAMIN) TABS tablet Take 1 tablet by mouth daily at 12 noon.   No facility-administered medications prior to visit.      [facility] - 02/10/24 1332       Pregnancy Intention Screening   Does the patient want to become pregnant in the next year? No    Does the patient's partner want to become pregnant in  the next year? No    Would the patient like to discuss contraceptive options today? No      Contraception Wrap Up   Current Method No Contraceptive Precautions    Contraception Counseling Provided Yes    How was the end contraceptive method provided? N/A              ROS Constitutional: Denied constitutional symptoms, night sweats, recent illness, fatigue, fever, insomnia and weight loss.  Eyes: Denied eye symptoms, eye pain, photophobia, vision change and visual disturbance.  Ears/Nose/Throat/Neck: Denied ear, nose, throat or neck symptoms, hearing loss, nasal discharge, sinus congestion and sore throat.  Cardiovascular: Denied cardiovascular symptoms, arrhythmia, chest pain/pressure, edema, exercise intolerance, orthopnea and palpitations.  Respiratory: Denied pulmonary symptoms, asthma, pleuritic pain, productive sputum, cough, dyspnea and wheezing.  Gastrointestinal: Denied, gastro-esophageal reflux, melena, nausea and vomiting.  Genitourinary: Denied genitourinary symptoms including symptomatic vaginal discharge, pelvic relaxation issues, and urinary complaints.  Musculoskeletal: Denied musculoskeletal symptoms, stiffness, swelling, muscle weakness and myalgia.  Dermatologic: Denied dermatology symptoms, rash and scar.  Neurologic: Denied neurology symptoms, dizziness, headache, neck pain and syncope.  Psychiatric: Denied psychiatric symptoms, anxiety and depression.  Endocrine: Denied endocrine symptoms including hot flashes and night sweats.    OBJECTIVE  BP 136/89   Pulse 85   Ht 5\' 6"  (1.676 m)   Wt 238 lb (108 kg)  LMP 01/25/2024   Breastfeeding No   BMI 38.41 kg/m    Physical examination General NAD, Conversant  HEENT Atraumatic; Op clear with mmm.  Normo-cephalic. Pupils reactive. Anicteric sclerae  Thyroid/Neck Smooth without nodularity or enlargement. Normal ROM.  Neck Supple.  Skin No rashes, lesions or ulceration. Normal palpated skin turgor. No  nodularity.  Breasts: No masses or discharge.  Symmetric.  No axillary adenopathy.  Lungs: Clear to auscultation.No rales or wheezes. Normal Respiratory effort, no retractions.  Heart: NSR.  No murmurs or rubs appreciated. No peripheral edema  Abdomen: Soft.  Non-tender.  No masses.  No HSM. No hernia  Extremities: Moves all appropriately.  Normal ROM for age. No lymphadenopathy.  Neuro: Oriented to PPT.  Normal mood. Normal affect.     Pelvic:   Vulva: Normal appearance.  No lesions.  Vagina: No lesions or abnormalities noted.  Support: Normal pelvic support.  Urethra No masses tenderness or scarring.  Meatus Normal size without lesions or prolapse.  Cervix: Normal appearance.  No lesions.  Anus: Normal exam.  No lesions.  Perineum: Normal exam.  No lesions.    ASSESSMENT  1) Annual exam 2) Due for Pap 3) Contraceptive counseling  PLAN 1) Physical exam as noted. Discussed healthy lifestyle choices and preventive care. Declines STI testing. Routine labs done with PCP. Discussed role of PCOS and insulin resistance in weight loss efforts. Encouraged 150 min/week of vigorous exercise and looking at her typical diet for areas she might be able to change. 2) Pap collected. F/u based on results. Reviewed ASCCP guidelines. 3) Discussed all available contraceptive options as well as cycle tracking/NFP. [name] plans to try NFP but may be interested in pills or NuvaRing and will reach out if she desires a prescription. Return in one year for annual exam or as needed for concerns.   Guadlupe Spanish, CNM

## 2024-02-14 ENCOUNTER — Encounter: Payer: Self-pay | Admitting: Obstetrics

## 2024-02-14 ENCOUNTER — Other Ambulatory Visit: Payer: Self-pay | Admitting: Obstetrics

## 2024-02-14 DIAGNOSIS — E063 Autoimmune thyroiditis: Secondary | ICD-10-CM

## 2024-02-14 LAB — CYTOLOGY - PAP
Comment: NEGATIVE
Diagnosis: NEGATIVE
High risk HPV: NEGATIVE

## 2024-02-14 MED ORDER — ETONOGESTREL-ETHINYL ESTRADIOL 0.12-0.015 MG/24HR VA RING: VAGINAL_RING | VAGINAL | 3 refills | Status: DC

## 2024-02-14 NOTE — Progress Notes (Signed)
 Rx sent for NuvaRing. Referral to [facility] endocrinology sent.  Glenetta Borg, CNM

## 2024-02-21 DIAGNOSIS — N926 Irregular menstruation, unspecified: Secondary | ICD-10-CM | POA: Diagnosis not present

## 2024-02-21 DIAGNOSIS — E66812 Obesity, class 2: Secondary | ICD-10-CM | POA: Diagnosis not present

## 2024-02-21 DIAGNOSIS — E063 Autoimmune thyroiditis: Secondary | ICD-10-CM | POA: Diagnosis not present

## 2024-02-21 DIAGNOSIS — Z6838 Body mass index (BMI) 38.0-38.9, adult: Secondary | ICD-10-CM | POA: Diagnosis not present

## 2024-04-24 DIAGNOSIS — Z6838 Body mass index (BMI) 38.0-38.9, adult: Secondary | ICD-10-CM | POA: Diagnosis not present

## 2024-04-24 DIAGNOSIS — E063 Autoimmune thyroiditis: Secondary | ICD-10-CM | POA: Diagnosis not present

## 2024-04-24 DIAGNOSIS — E66812 Obesity, class 2: Secondary | ICD-10-CM | POA: Diagnosis not present

## 2024-05-23 ENCOUNTER — Telehealth

## 2024-05-23 DIAGNOSIS — J020 Streptococcal pharyngitis: Secondary | ICD-10-CM

## 2024-05-24 MED ORDER — AMOXICILLIN 500 MG PO TABS: 500.0000 mg | ORAL_TABLET | Freq: Two times a day (BID) | ORAL | 0 refills | Status: AC

## 2024-05-24 NOTE — Progress Notes (Signed)

## 2024-05-24 NOTE — Progress Notes (Signed)
 I have spent 5 minutes in review of e-visit questionnaire, review and updating patient chart, medical decision making and response to patient.   Piedad Climes, PA-C

## 2024-08-16 ENCOUNTER — Telehealth: Admitting: Physician Assistant

## 2024-08-16 DIAGNOSIS — H109 Unspecified conjunctivitis: Secondary | ICD-10-CM

## 2024-08-16 DIAGNOSIS — Z6838 Body mass index (BMI) 38.0-38.9, adult: Secondary | ICD-10-CM | POA: Diagnosis not present

## 2024-08-16 DIAGNOSIS — E063 Autoimmune thyroiditis: Secondary | ICD-10-CM | POA: Diagnosis not present

## 2024-08-16 DIAGNOSIS — E66812 Obesity, class 2: Secondary | ICD-10-CM | POA: Diagnosis not present

## 2024-08-16 MED ORDER — OFLOXACIN 0.3 % OP SOLN: 1.0000 [drp] | Freq: Four times a day (QID) | OPHTHALMIC | 0 refills | Status: AC

## 2024-08-16 NOTE — Progress Notes (Signed)

## 2024-11-05 DIAGNOSIS — M722 Plantar fascial fibromatosis: Secondary | ICD-10-CM | POA: Diagnosis not present

## 2024-11-06 NOTE — Progress Notes (Unsigned)
 New patient visit   Patient: Julie Rowe   DOB: 1991-11-20   33 y.o. Female  MRN: [id number] Visit Date: 11/07/2024  Today's healthcare provider: Isaiah DELENA Pepper, MD   No chief complaint on file.  Subjective    Julie Rowe is a 33 y.o. female who presents today as a new patient to establish care.   Discussed the use of AI scribe software for clinical note transcription with the patient, who gave verbal consent to proceed.  History of Present Illness      Past Medical History:  Diagnosis Date   Hypothyroid    Thyroid  disease    Past Surgical History:  Procedure Laterality Date   TONSILLECTOMY     TONSILLECTOMY AND ADENOIDECTOMY     Family Status  Relation Name Status   Mother  (Not Specified)   Brother  (Not Specified)   MGM  (Not Specified)   PGM  (Not Specified)   Neg Hx  (Not Specified)  No partnership data on file   Family History  Problem Relation Age of Onset   Diabetes Mother    Thyroid  disease Mother    Diabetes Brother    Thyroid  disease Brother    Diabetes Maternal Grandmother    Thyroid  disease Paternal Grandmother    Breast cancer Neg Hx    Ovarian cancer Neg Hx    Colon cancer Neg Hx    Social History   Socioeconomic History   Marital status: Married    Spouse name: Julie Rowe    Number of children: Not on file   Years of education: Not on file   Highest education level: Bachelor's degree (e.g., BA, AB, BS)  Occupational History   Not on file  Tobacco Use   Smoking status: Never   Smokeless tobacco: Never  Vaping Use   Vaping status: Never Used  Substance and Sexual Activity   Alcohol use: No   Drug use: No   Sexual activity: Yes    Birth control/protection: None    Comment: Natural family planning  Other Topics Concern   Not on file  Social History Narrative   Not on file   Social Drivers of Health   Tobacco Use: Unknown (08/16/2024)   Received from [facility]   Patient History    Smoking Tobacco Use: Never     Smokeless Tobacco Use: Unknown    Passive Exposure: Not on file  Financial Resource Strain: Low Risk (10/31/2024)   Overall Financial Resource Strain (CARDIA)    Difficulty of Paying Living Expenses: Not very hard  Food Insecurity: No Food Insecurity (10/31/2024)   Epic    Worried About Radiation Protection Practitioner of Food in the Last Year: Never true    Ran Out of Food in the Last Year: Never true  Transportation Needs: No Transportation Needs (10/31/2024)   Epic    Lack of Transportation (Medical): No    Lack of Transportation (Non-Medical): No  Physical Activity: Insufficiently Active (10/31/2024)   Exercise Vital Sign    Days of Exercise per Week: 2 days    Minutes of Exercise per Session: 30 min  Stress: Stress Concern Present (10/31/2024)   [employer] of Occupational Health - Occupational Stress Questionnaire    Feeling of Stress: To some extent  Social Connections: Socially Integrated (10/31/2024)   Social Connection and Isolation Panel    Frequency of Communication with Friends and Family: Three times a week    Frequency of Social Gatherings with Friends and Family: Once  a week    Attends Religious Services: More than 4 times per year    Active Member of Clubs or Organizations: Yes    Attends Banker Meetings: More than 4 times per year    Marital Status: Married  Depression (EYV7-0): Not on file  Alcohol Screen: Low Risk (10/31/2024)   Alcohol Screen    Last Alcohol Screening Score (AUDIT): 0  Housing: Low Risk (10/31/2024)   Epic    Unable to Pay for Housing in the Last Year: No    Number of Times Moved in the Last Year: 0    Homeless in the Last Year: No  Utilities: Low Risk (02/21/2024)   Received from [facility]   Utilities    Within the past 12 months, have you been unable to get utilities(heat, electricity) when it was really needed?: No  Health Literacy: Not on file   Show/hide medication list[1] Allergies[2]  Reviews of Systems as noted in HPI.  {Insert  previous labs (optional):23779} {See past labs  Heme  Chem  Endocrine  Serology  Results Review (optional):1}   Objective    There were no vitals taken for this visit. {Insert last BP/Wt (optional):23777}{See vitals history (optional):1}   Physical Exam  Depression Screen    07/03/2021    1:54 PM 08/08/2019   10:43 AM  PHQ 2/9 Scores  PHQ - 2 Score 0 0   No results found for any visits on 11/07/24.  Assessment & Plan      Problem List Items Addressed This Visit   None   Assessment and Plan Assessment & Plan      No follow-ups on file.      Isaiah DELENA Pepper, MD  [facility] [phone] (phone) [phone] (fax)    [1]  Outpatient Medications Prior to Visit  Medication Sig   etonogestrel -ethinyl estradiol  (NUVARING) 0.12-0.015 MG/24HR vaginal ring Insert vaginally and leave in place for 3 consecutive weeks, then remove for 1 week. Or, leave in for 4 weeks and replace without a break.   No facility-administered medications prior to visit.  [2] No Known Allergies

## 2024-11-07 ENCOUNTER — Ambulatory Visit (INDEPENDENT_AMBULATORY_CARE_PROVIDER_SITE_OTHER)

## 2024-11-07 VITALS — BP 124/80 | HR 80 | Temp 98.1°F | Ht 66.5 in | Wt 233.0 lb

## 2024-11-07 DIAGNOSIS — Z6837 Body mass index (BMI) 37.0-37.9, adult: Secondary | ICD-10-CM | POA: Diagnosis not present

## 2024-11-07 DIAGNOSIS — E063 Autoimmune thyroiditis: Secondary | ICD-10-CM | POA: Diagnosis not present

## 2024-11-07 NOTE — Assessment & Plan Note (Signed)
 Chronic, controlled. Last TSH of 5.687 with recent levothyroxine  dose increase to 137mcg. Previously was following with endocrinology, but prefers to be managed by PCP.  - Will recheck TSH today and adjust dose as appropriate. - Refill Synthroid  - Follow up in 6 months if TSH is stable

## 2024-11-07 NOTE — Assessment & Plan Note (Addendum)
 BMI 37 without comorbidity. Patient is working on lifestyle changes for weight loss. Continue eating a balanced diet and incorporating movement into daily routine.

## 2024-11-08 ENCOUNTER — Ambulatory Visit: Payer: Self-pay

## 2024-11-08 LAB — TSH: TSH: 0.947 u[IU]/mL (ref 0.450–4.500)

## 2024-11-08 MED ORDER — LEVOTHYROXINE SODIUM 137 MCG PO TABS: 137.0000 ug | ORAL_TABLET | Freq: Every day | ORAL | 3 refills | Status: AC

## 2024-11-26 ENCOUNTER — Other Ambulatory Visit: Payer: Self-pay | Admitting: Obstetrics

## 2025-05-15 ENCOUNTER — Ambulatory Visit
